# Patient Record
Sex: Female | Born: 1950
Health system: Southern US, Community
[De-identification: ages and names within clinical notes are randomized; demographics above are authoritative.]

## PROBLEM LIST (undated history)

## (undated) DIAGNOSIS — C50919 Malignant neoplasm of unspecified site of unspecified female breast: Secondary | ICD-10-CM

## (undated) DIAGNOSIS — E559 Vitamin D deficiency, unspecified: Secondary | ICD-10-CM

## (undated) DIAGNOSIS — N393 Stress incontinence (female) (male): Secondary | ICD-10-CM

## (undated) DIAGNOSIS — T7840XA Allergy, unspecified, initial encounter: Secondary | ICD-10-CM

## (undated) DIAGNOSIS — Z923 Personal history of irradiation: Secondary | ICD-10-CM

## (undated) DIAGNOSIS — K219 Gastro-esophageal reflux disease without esophagitis: Secondary | ICD-10-CM

## (undated) HISTORY — DX: Vitamin D deficiency, unspecified: E55.9

## (undated) HISTORY — DX: Malignant neoplasm of unspecified site of unspecified female breast: C50.919

## (undated) HISTORY — DX: Stress incontinence (female) (male): N39.3

---

## 2002-09-19 ENCOUNTER — Emergency Department (HOSPITAL_COMMUNITY): Admission: EM | Admit: 2002-09-19 | Discharge: 2002-09-19 | Payer: Self-pay | Admitting: Emergency Medicine

## 2002-09-19 ENCOUNTER — Encounter: Payer: Self-pay | Admitting: Emergency Medicine

## 2002-10-09 ENCOUNTER — Encounter: Payer: Self-pay | Admitting: Internal Medicine

## 2002-10-09 ENCOUNTER — Ambulatory Visit (HOSPITAL_COMMUNITY): Admission: RE | Admit: 2002-10-09 | Discharge: 2002-10-09 | Payer: Self-pay | Admitting: Internal Medicine

## 2002-10-09 ENCOUNTER — Encounter: Admission: RE | Admit: 2002-10-09 | Discharge: 2002-10-09 | Payer: Self-pay | Admitting: Internal Medicine

## 2002-10-16 ENCOUNTER — Encounter: Payer: Self-pay | Admitting: Internal Medicine

## 2002-10-16 ENCOUNTER — Encounter: Admission: RE | Admit: 2002-10-16 | Discharge: 2002-10-16 | Payer: Self-pay | Admitting: Internal Medicine

## 2003-02-08 ENCOUNTER — Encounter: Admission: RE | Admit: 2003-02-08 | Discharge: 2003-02-08 | Payer: Self-pay | Admitting: Obstetrics and Gynecology

## 2003-02-08 ENCOUNTER — Other Ambulatory Visit: Admission: RE | Admit: 2003-02-08 | Discharge: 2003-02-08 | Payer: Self-pay | Admitting: Obstetrics and Gynecology

## 2003-08-08 ENCOUNTER — Encounter: Admission: RE | Admit: 2003-08-08 | Discharge: 2003-08-08 | Payer: Self-pay | Admitting: Obstetrics and Gynecology

## 2003-08-08 ENCOUNTER — Encounter: Payer: Self-pay | Admitting: Obstetrics and Gynecology

## 2003-08-23 ENCOUNTER — Encounter: Payer: Self-pay | Admitting: Family Medicine

## 2003-08-23 ENCOUNTER — Encounter: Admission: RE | Admit: 2003-08-23 | Discharge: 2003-08-23 | Payer: Self-pay | Admitting: Family Medicine

## 2004-02-05 ENCOUNTER — Encounter: Admission: RE | Admit: 2004-02-05 | Discharge: 2004-02-05 | Payer: Self-pay | Admitting: Obstetrics and Gynecology

## 2004-02-14 ENCOUNTER — Other Ambulatory Visit: Admission: RE | Admit: 2004-02-14 | Discharge: 2004-02-14 | Payer: Self-pay | Admitting: Obstetrics and Gynecology

## 2005-02-16 ENCOUNTER — Other Ambulatory Visit: Admission: RE | Admit: 2005-02-16 | Discharge: 2005-02-16 | Payer: Self-pay | Admitting: Obstetrics and Gynecology

## 2005-12-18 ENCOUNTER — Encounter: Admission: RE | Admit: 2005-12-18 | Discharge: 2005-12-18 | Payer: Self-pay | Admitting: Obstetrics and Gynecology

## 2006-02-17 ENCOUNTER — Other Ambulatory Visit: Admission: RE | Admit: 2006-02-17 | Discharge: 2006-02-17 | Payer: Self-pay | Admitting: Obstetrics & Gynecology

## 2006-12-21 ENCOUNTER — Encounter: Admission: RE | Admit: 2006-12-21 | Discharge: 2006-12-21 | Payer: Self-pay | Admitting: Obstetrics and Gynecology

## 2007-01-23 ENCOUNTER — Emergency Department (HOSPITAL_COMMUNITY): Admission: EM | Admit: 2007-01-23 | Discharge: 2007-01-23 | Payer: Self-pay | Admitting: Family Medicine

## 2007-03-24 ENCOUNTER — Other Ambulatory Visit: Admission: RE | Admit: 2007-03-24 | Discharge: 2007-03-24 | Payer: Self-pay | Admitting: Obstetrics & Gynecology

## 2008-05-09 ENCOUNTER — Encounter: Admission: RE | Admit: 2008-05-09 | Discharge: 2008-05-09 | Payer: Self-pay | Admitting: Obstetrics and Gynecology

## 2008-06-28 ENCOUNTER — Other Ambulatory Visit: Admission: RE | Admit: 2008-06-28 | Discharge: 2008-06-28 | Payer: Self-pay | Admitting: Obstetrics and Gynecology

## 2010-01-23 ENCOUNTER — Encounter: Admission: RE | Admit: 2010-01-23 | Discharge: 2010-01-23 | Payer: Self-pay | Admitting: Obstetrics and Gynecology

## 2010-12-20 ENCOUNTER — Encounter: Payer: Self-pay | Admitting: Obstetrics and Gynecology

## 2010-12-21 ENCOUNTER — Encounter: Payer: Self-pay | Admitting: Family Medicine

## 2011-04-30 ENCOUNTER — Telehealth: Payer: Self-pay

## 2011-04-30 NOTE — Telephone Encounter (Signed)
Wrong pt

## 2011-05-27 ENCOUNTER — Other Ambulatory Visit: Payer: Self-pay | Admitting: Obstetrics and Gynecology

## 2011-05-27 DIAGNOSIS — Z1231 Encounter for screening mammogram for malignant neoplasm of breast: Secondary | ICD-10-CM

## 2011-06-01 ENCOUNTER — Ambulatory Visit
Admission: RE | Admit: 2011-06-01 | Discharge: 2011-06-01 | Disposition: A | Discharge status: Home / Self Care | Payer: Commercial Managed Care - PPO | Source: Ambulatory Visit | Attending: Obstetrics and Gynecology | Admitting: Obstetrics and Gynecology

## 2011-06-01 DIAGNOSIS — Z1231 Encounter for screening mammogram for malignant neoplasm of breast: Secondary | ICD-10-CM

## 2012-01-01 ENCOUNTER — Other Ambulatory Visit: Payer: Self-pay | Admitting: Obstetrics and Gynecology

## 2012-01-01 DIAGNOSIS — N644 Mastodynia: Secondary | ICD-10-CM

## 2012-01-07 ENCOUNTER — Ambulatory Visit
Admission: RE | Admit: 2012-01-07 | Discharge: 2012-01-07 | Disposition: A | Discharge status: Home / Self Care | Payer: Commercial Managed Care - PPO | Source: Ambulatory Visit | Attending: Obstetrics and Gynecology | Admitting: Obstetrics and Gynecology

## 2012-01-07 DIAGNOSIS — N644 Mastodynia: Secondary | ICD-10-CM

## 2012-12-02 LAB — HM PAP SMEAR: HM Pap smear: NEGATIVE

## 2012-12-06 ENCOUNTER — Other Ambulatory Visit: Payer: Self-pay | Admitting: Nurse Practitioner

## 2012-12-06 DIAGNOSIS — Z780 Asymptomatic menopausal state: Secondary | ICD-10-CM

## 2013-02-24 ENCOUNTER — Encounter: Payer: Self-pay | Admitting: Physician Assistant

## 2013-02-24 ENCOUNTER — Ambulatory Visit (INDEPENDENT_AMBULATORY_CARE_PROVIDER_SITE_OTHER): Payer: 59 | Admitting: Physician Assistant

## 2013-02-24 VITALS — BP 142/81 | HR 67 | Temp 98.1°F | Ht 64.5 in | Wt 184.6 lb

## 2013-02-24 DIAGNOSIS — R42 Dizziness and giddiness: Secondary | ICD-10-CM

## 2013-02-24 DIAGNOSIS — J322 Chronic ethmoidal sinusitis: Secondary | ICD-10-CM

## 2013-02-24 MED ORDER — MECLIZINE HCL 25 MG PO TABS: 25.0000 mg | ORAL_TABLET | Freq: Every evening | ORAL | Status: DC | PRN

## 2013-02-24 MED ORDER — AMOXICILLIN 875 MG PO TABS: 875.0000 mg | ORAL_TABLET | Freq: Two times a day (BID) | ORAL | Status: DC

## 2013-02-24 NOTE — Progress Notes (Signed)
  Subjective:    Patient ID: Deborah Mercado, female    DOB: 06-01-1951, 62 y.o.   MRN: [id number]  HPI Woke this am with dizziness and a swelling of scalp behind right ear; hx of cellulitis which felt similar to this    Review of Systems  HENT: Positive for tinnitus.   Neurological: Positive for dizziness and light-headedness.  All other systems reviewed and are negative.       Objective:   Physical Exam  Vitals reviewed. Constitutional: She is oriented to person, place, and time. She appears well-developed and well-nourished.  HENT:  Head: Atraumatic.  Right Ear: External ear normal.  Left Ear: External ear normal.  Right post auricular node swollen and tender, no erythema to the scalp in that region Right TM retraction  Eyes: Conjunctivae and EOM are normal. Pupils are equal, round, and reactive to light.  Neck: Normal range of motion. Neck supple.  Cardiovascular: Normal rate, regular rhythm and normal heart sounds.   Pulmonary/Chest: Effort normal and breath sounds normal.  Neurological: She is alert and oriented to person, place, and time.  Skin: Skin is warm and dry.  Psychiatric: She has a normal mood and affect. Her behavior is normal. Judgment and thought content normal.          Assessment & Plan:  Ethmoid sinusitis - Plan: amoxicillin (AMOXIL) 875 MG tablet  Vertigo - Plan: meclizine (ANTIVERT) 25 MG tablet

## 2013-03-02 ENCOUNTER — Other Ambulatory Visit: Payer: Self-pay | Admitting: Physician Assistant

## 2013-03-02 ENCOUNTER — Telehealth: Payer: Self-pay | Admitting: Physician Assistant

## 2013-03-02 DIAGNOSIS — J322 Chronic ethmoidal sinusitis: Secondary | ICD-10-CM

## 2013-03-02 NOTE — Telephone Encounter (Signed)
Dont usually do refills on Amoxicillin. Will agree to refill meclizine

## 2013-03-02 NOTE — Telephone Encounter (Signed)
Pt said that andrew told her that she would need two doses of the antibiotic can we please refill

## 2013-03-02 NOTE — Telephone Encounter (Signed)
Would like to have the Amox. Med called to [facility].  Feeling better but wants to be sure it goes completely away. Can't come back in for office visit.

## 2013-03-03 ENCOUNTER — Other Ambulatory Visit: Payer: Self-pay | Admitting: Physician Assistant

## 2013-03-03 DIAGNOSIS — J322 Chronic ethmoidal sinusitis: Secondary | ICD-10-CM

## 2013-03-03 MED ORDER — AMOXICILLIN 875 MG PO TABS: 875.0000 mg | ORAL_TABLET | Freq: Two times a day (BID) | ORAL | Status: DC

## 2013-03-03 NOTE — Telephone Encounter (Signed)
Pt upset that she could not have a second prescription of antibiotics.  She felt ACM should not have suggested he may need to give her one and then not follow through.  She says she will not come for her follow up visit and may not return at all.   She had verbalized feeling much better on our conversation a day earlier.

## 2013-03-03 NOTE — Telephone Encounter (Signed)
Wille Celeste it looks like you missed my message from earlier today - you were to call and tell her the antibiotic was authorized to the pharmacy

## 2013-03-03 NOTE — Telephone Encounter (Signed)
Med authorized; please notify pt

## 2013-03-03 NOTE — Telephone Encounter (Signed)
Refill of Amoxicillin sent to [facility].

## 2013-03-14 ENCOUNTER — Telehealth: Payer: Self-pay | Admitting: Nurse Practitioner

## 2013-03-16 NOTE — Telephone Encounter (Signed)
LEFT A DETAILED MESSAGE THAT IF SHE STILL NEEDED [place] TO PLEASE CALL AND ASK FOR THE TRIAGE NURSE

## 2013-04-04 ENCOUNTER — Telehealth: Payer: Self-pay | Admitting: Nurse Practitioner

## 2013-04-04 ENCOUNTER — Other Ambulatory Visit: Payer: Self-pay | Admitting: Orthopedic Surgery

## 2013-04-04 NOTE — Telephone Encounter (Signed)
LM for pt that samples will be in front office to be picked up during business hours. Savings card also in bag.  aa

## 2013-04-04 NOTE — Telephone Encounter (Signed)
Spoke with pt who has been on Vesicare for a while. Pt reports her copay has gone up to $20 and she is wondering if she could get some samples. Please advise.

## 2013-04-04 NOTE — Telephone Encounter (Signed)
Patient is asking for Vesicare samples

## 2013-04-04 NOTE — Telephone Encounter (Signed)
By all means if we have samples lets help her out if we have the dose she is on.

## 2013-04-11 ENCOUNTER — Ambulatory Visit (HOSPITAL_BASED_OUTPATIENT_CLINIC_OR_DEPARTMENT_OTHER)
Admission: RE | Admit: 2013-04-11 | Discharge: 2013-04-11 | Disposition: A | Discharge status: Home / Self Care | Payer: 59 | Source: Ambulatory Visit | Attending: Family Medicine | Admitting: Family Medicine

## 2013-04-11 ENCOUNTER — Other Ambulatory Visit (HOSPITAL_BASED_OUTPATIENT_CLINIC_OR_DEPARTMENT_OTHER): Payer: Self-pay | Admitting: Family Medicine

## 2013-04-11 DIAGNOSIS — S82009A Unspecified fracture of unspecified patella, initial encounter for closed fracture: Secondary | ICD-10-CM | POA: Insufficient documentation

## 2013-04-11 DIAGNOSIS — S99929A Unspecified injury of unspecified foot, initial encounter: Secondary | ICD-10-CM

## 2013-04-11 DIAGNOSIS — S8990XA Unspecified injury of unspecified lower leg, initial encounter: Secondary | ICD-10-CM

## 2013-04-11 DIAGNOSIS — W19XXXA Unspecified fall, initial encounter: Secondary | ICD-10-CM | POA: Insufficient documentation

## 2013-04-11 DIAGNOSIS — S99919A Unspecified injury of unspecified ankle, initial encounter: Secondary | ICD-10-CM

## 2013-08-01 ENCOUNTER — Other Ambulatory Visit: Payer: Self-pay

## 2013-08-01 DIAGNOSIS — Z1231 Encounter for screening mammogram for malignant neoplasm of breast: Secondary | ICD-10-CM

## 2013-08-07 ENCOUNTER — Ambulatory Visit
Admission: RE | Admit: 2013-08-07 | Discharge: 2013-08-07 | Disposition: A | Discharge status: Home / Self Care | Payer: 59 | Source: Ambulatory Visit

## 2013-08-07 DIAGNOSIS — Z1231 Encounter for screening mammogram for malignant neoplasm of breast: Secondary | ICD-10-CM

## 2013-08-14 ENCOUNTER — Telehealth: Payer: Self-pay | Admitting: Nurse Practitioner

## 2013-08-14 NOTE — Telephone Encounter (Signed)
LMTCB

## 2013-08-14 NOTE — Telephone Encounter (Signed)
Pt would like to have to PG fill out and sign a form for her to be exempt from taking the flu shot. If this is something she can do she will bring it by tomorrow.

## 2013-08-14 NOTE — Telephone Encounter (Signed)
Patient is coming by tomorrow to bring a form in to be filled out. Wants to know if we have Visicare samples? If so please leave some up front. She will be here after lunch.

## 2013-08-15 NOTE — Telephone Encounter (Signed)
Samples to front desk  Lot numbers:  [id number] exp 10/2015 [id number] exp 09/2014 [id number] ep 11/2013

## 2013-08-15 NOTE — Telephone Encounter (Signed)
Patient came in and dropped off forms for Deborah Mercado. Also, patient still wants to know about samples for Vesicare?

## 2013-08-15 NOTE — Telephone Encounter (Signed)
I have attempted to contact this patient by phone with the following results: left message to return my call on answering machine (home).  

## 2013-08-15 NOTE — Telephone Encounter (Signed)
Patient with hx of Localized reaction to influenza vaccine in 2008 with swelling to arm/bicep. Reported to [employer] per paper chart. Paper chart and form to your office.  Samples of Vesicare 5 mg available, okay to give patient when she picks up forms?

## 2013-08-15 NOTE — Telephone Encounter (Signed)
Yes will complete form for her - she is allergic to the influenza vaccine.

## 2013-08-15 NOTE — Telephone Encounter (Signed)
Yes form is completed and signed.  If we have samples OK to give patient but I believe that Amy has looked for this already.

## 2013-08-15 NOTE — Telephone Encounter (Signed)
Call to pt's VM and LM that we do not have any vesicare 5 mg samples, and likely will not be getting any more. Advised that PG would be glad to refill if needed. Pt to call back.

## 2013-08-16 NOTE — Telephone Encounter (Signed)
Pt returned call and form was faxed.

## 2013-08-18 NOTE — Telephone Encounter (Signed)
Spoke with patient. She states form was to be faxed.  Advised Vesicare Samples at front desk, she will pick up.

## 2013-09-19 ENCOUNTER — Telehealth: Payer: Self-pay | Admitting: Emergency Medicine

## 2013-09-19 NOTE — Telephone Encounter (Signed)
Patient has one bag of samples at front desk. Called to see if she wants to pick up.  There were two bags and she only picked up one.  Message left to return call to [place] at [phone].

## 2013-09-20 NOTE — Telephone Encounter (Signed)
Spoke with patient,. She will come pick up remainder of samples.

## 2013-12-13 ENCOUNTER — Ambulatory Visit: Payer: Self-pay | Admitting: Obstetrics and Gynecology

## 2013-12-14 ENCOUNTER — Ambulatory Visit: Payer: Self-pay | Admitting: Obstetrics and Gynecology

## 2014-02-19 ENCOUNTER — Encounter: Payer: Self-pay | Admitting: Nurse Practitioner

## 2014-02-19 ENCOUNTER — Ambulatory Visit (INDEPENDENT_AMBULATORY_CARE_PROVIDER_SITE_OTHER): Payer: 59 | Admitting: Nurse Practitioner

## 2014-02-19 ENCOUNTER — Telehealth: Payer: Self-pay | Admitting: Nurse Practitioner

## 2014-02-19 VITALS — BP 114/66 | HR 72 | Ht 64.5 in | Wt 186.0 lb

## 2014-02-19 DIAGNOSIS — Z01419 Encounter for gynecological examination (general) (routine) without abnormal findings: Secondary | ICD-10-CM

## 2014-02-19 DIAGNOSIS — K219 Gastro-esophageal reflux disease without esophagitis: Secondary | ICD-10-CM | POA: Insufficient documentation

## 2014-02-19 DIAGNOSIS — E559 Vitamin D deficiency, unspecified: Secondary | ICD-10-CM | POA: Insufficient documentation

## 2014-02-19 DIAGNOSIS — N3941 Urge incontinence: Secondary | ICD-10-CM

## 2014-02-19 DIAGNOSIS — Z0000 Encounter for general adult medical examination without abnormal findings: Secondary | ICD-10-CM | POA: Insufficient documentation

## 2014-02-19 LAB — POCT URINALYSIS DIPSTICK
Bilirubin, UA: NEGATIVE
Glucose, UA: NEGATIVE
Ketones, UA: NEGATIVE
LEUKOCYTES UA: NEGATIVE
NITRITE UA: NEGATIVE
PH UA: 5
PROTEIN UA: NEGATIVE
RBC UA: NEGATIVE
Urobilinogen, UA: NEGATIVE

## 2014-02-19 MED ORDER — PANTOPRAZOLE SODIUM 20 MG PO TBEC: 20.0000 mg | DELAYED_RELEASE_TABLET | Freq: Every day | ORAL | Status: DC

## 2014-02-19 MED ORDER — ESTRADIOL 1 MG PO TABS
1.0000 mg | ORAL_TABLET | Freq: Every day | ORAL | Status: DC
Start: 2014-02-19 — End: 2015-03-18

## 2014-02-19 MED ORDER — MEDROXYPROGESTERONE ACETATE 5 MG PO TABS: 5.0000 mg | ORAL_TABLET | Freq: Every day | ORAL | Status: DC

## 2014-02-19 MED ORDER — SOLIFENACIN SUCCINATE 5 MG PO TABS: 5.0000 mg | ORAL_TABLET | Freq: Every day | ORAL | Status: DC

## 2014-02-19 NOTE — Patient Instructions (Signed)

## 2014-02-19 NOTE — Progress Notes (Signed)
Patient ID: Deborah Mercado, female   DOB: 07-30-1951, 63 y.o.   MRN: [id number] 63 y.o. G1P1001 Legally Separated Caucasian Fe here for annual exam.  [place] on mothers day of last year and fractured left patella. Had knee brace for 8 weeks.  Same partner but not SA in years. Recent flare of GERD - needs a refill on Protonix.  Patient's last menstrual period was 11/30/2004.          Sexually active: no  The current method of family planning is abstinence.    Exercising: no  The patient does not participate in regular exercise at present. Smoker:  no  Health Maintenance: Pap:  12/02/12, WNL, neg HR HPV MMG:  08/07/13, Bi-Rads 1: negative Colonoscopy:  none BMD:  none TDaP:  2014  Labs:  HB:  13.7 Urine:  Negative    reports that she has never smoked. She has never used smokeless tobacco. She reports that she does not drink alcohol or use illicit drugs.  Past Medical History  Diagnosis Date  . Vitamin D deficiency   . SUI (stress urinary incontinence, female)     History reviewed. No pertinent past surgical history.  Current Outpatient Prescriptions  Medication Sig Dispense Refill  . estradiol (ESTRACE) 1 MG tablet Take 1 tablet (1 mg total) by mouth daily.  90 tablet  3  . medroxyPROGESTERone (PROVERA) 5 MG tablet Take 1 tablet (5 mg total) by mouth daily.  90 tablet  3  . solifenacin (VESICARE) 5 MG tablet Take 1 tablet (5 mg total) by mouth daily.  90 tablet  3  . pantoprazole (PROTONIX) 20 MG tablet Take 1 tablet (20 mg total) by mouth daily.  90 tablet  1  . Vitamin D, Ergocalciferol, (DRISDOL) 50000 UNITS CAPS capsule Take 50,000 Units by mouth every 7 (seven) days.       No current facility-administered medications for this visit.    Family History  Problem Relation Age of Onset  . Heart disease Mother   . Rheum arthritis Mother   . Hypertension Mother   . Rheum arthritis Maternal Grandmother   . Heart attack Maternal Grandmother   . Heart disease Maternal Grandmother      ROS:  Pertinent items are noted in HPI.  Otherwise, a comprehensive ROS was negative.  Exam:   BP 114/66  Pulse 72  Ht 5' 4.5" (1.638 m)  Wt 186 lb (84.369 kg)  BMI 31.45 kg/m2  LMP 11/30/2004 Height: 5' 4.5" (163.8 cm)  Ht Readings from Last 3 Encounters:  02/19/14 5' 4.5" (1.638 m)  02/24/13 5' 4.5" (1.638 m)    General appearance: alert, cooperative and appears stated age Head: Normocephalic, without obvious abnormality, atraumatic Neck: no adenopathy, supple, symmetrical, trachea midline and thyroid normal to inspection and palpation Lungs: clear to auscultation bilaterally Breasts: normal appearance, no masses or tenderness Heart: regular rate and rhythm Abdomen: soft, non-tender; no masses,  no organomegaly Extremities: extremities normal, atraumatic, no cyanosis or edema Skin: Skin color, texture, turgor normal. No rashes or lesions Lymph nodes: Cervical, supraclavicular, and axillary nodes normal. No abnormal inguinal nodes palpated Neurologic: Grossly normal   Pelvic: External genitalia:  no lesions              Urethra:  normal appearing urethra with no masses, tenderness or lesions              Bartholin's and Skene's: normal  Vagina: normal appearing vagina with normal color and discharge, no lesions              Cervix: anteverted              Pap taken: no Bimanual Exam:  Uterus:  normal size, contour, position, consistency, mobility, non-tender              Adnexa: no mass, fullness, tenderness               Rectovaginal: Confirms               Anus:  normal sphincter tone, no lesions  A:  Well Woman with normal exam  Postmenopausal on HRT  SUI doing well on Vesicare  History of Vit D deficiency  History of GERD - with recent flare  P:   Pap smear as per guidelines   Mammogram due 9/15  Refill on HRT for a year - she is now taking 1/2 tablet of Estradiol and Provera - maybe willing to come off by next year.  Counseled with risk of  DVT, CVA, cancer, etc.  Refill on Vesicare and Protonix - also refill Vit D and await lab return.  Follow with labs  Counseled on breast self exam, mammography screening, use and side effects of HRT, adequate intake of calcium and vitamin D, diet and exercise, Kegel's exercises return annually or prn  An After Visit Summary was printed and given to the patient.

## 2014-02-19 NOTE — Telephone Encounter (Signed)
Patient requesting a note for work stating she has bladder urgency. She states Deborah Mercado has done this before and it is so she can use the bathroom when she needs to. Please call patient when the note is ready and she will come and pick it up.

## 2014-02-20 LAB — COMPREHENSIVE METABOLIC PANEL
ALK PHOS: 64 U/L (ref 39–117)
ALT: 9 U/L (ref 0–35)
AST: 16 U/L (ref 0–37)
Albumin: 4.2 g/dL (ref 3.5–5.2)
BUN: 9 mg/dL (ref 6–23)
CALCIUM: 9.2 mg/dL (ref 8.4–10.5)
CHLORIDE: 107 meq/L (ref 96–112)
CO2: 27 mEq/L (ref 19–32)
CREATININE: 0.8 mg/dL (ref 0.50–1.10)
Glucose, Bld: 88 mg/dL (ref 70–99)
POTASSIUM: 4 meq/L (ref 3.5–5.3)
Sodium: 142 mEq/L (ref 135–145)
Total Bilirubin: 0.7 mg/dL (ref 0.2–1.2)
Total Protein: 7 g/dL (ref 6.0–8.3)

## 2014-02-20 LAB — LIPID PANEL
Cholesterol: 187 mg/dL (ref 0–200)
HDL: 54 mg/dL (ref >39)
LDL CALC: 120 mg/dL — AB (ref 0–99)
TRIGLYCERIDES: 67 mg/dL (ref <150)
Total CHOL/HDL Ratio: 3.5 Ratio
VLDL: 13 mg/dL (ref 0–40)

## 2014-02-20 LAB — VITAMIN D 25 HYDROXY (VIT D DEFICIENCY, FRACTURES): Vit D, 25-Hydroxy: 27 ng/mL — ABNORMAL LOW (ref 30–89)

## 2014-02-20 LAB — TSH: TSH: 1.429 u[IU]/mL (ref 0.350–4.500)

## 2014-02-20 LAB — HEMOGLOBIN, FINGERSTICK: HEMOGLOBIN, FINGERSTICK: 13.7 g/dL (ref 12.0–16.0)

## 2014-02-20 MED ORDER — VITAMIN D (ERGOCALCIFEROL) 1.25 MG (50000 UNIT) PO CAPS: 50000.0000 [IU] | ORAL_CAPSULE | ORAL | Status: DC

## 2014-02-20 NOTE — Progress Notes (Signed)
Encounter reviewed by Dr. Josefa Half. Colonoscopy recommended if not already scheduled.

## 2014-02-21 ENCOUNTER — Telehealth: Payer: Self-pay | Admitting: *Deleted

## 2014-02-21 NOTE — Telephone Encounter (Signed)
I have attempted to contact this patient by phone with the following results: left message to return my call on answering machine (home per DPR).  

## 2014-02-21 NOTE — Telephone Encounter (Signed)
Message copied by Graylon Good on Wed Feb 21, 2014  1:36 PM ------      Message from: Kem Boroughs R      Created: Tue Feb 20, 2014  7:24 AM       Let patient know that Vit D is low and to continue on Vit D per protocol. Refill has been sent to [facility]. She had been off Vit D for several weeks before this lab.  Also lipid panel is normal other than LDL slight up at 120.  TSH and CMP is normal. ------

## 2014-02-21 NOTE — Telephone Encounter (Signed)
Pt presented to office to pick up note.  Note written and given to patient.  Hardcopy of note sent to scan.

## 2014-02-22 NOTE — Telephone Encounter (Signed)
Pt notified in result note.  

## 2014-08-28 ENCOUNTER — Encounter: Payer: Self-pay | Admitting: Certified Nurse Midwife

## 2014-08-28 ENCOUNTER — Ambulatory Visit (INDEPENDENT_AMBULATORY_CARE_PROVIDER_SITE_OTHER): Payer: 59 | Admitting: Certified Nurse Midwife

## 2014-08-28 VITALS — BP 120/80 | HR 76 | Temp 98.9°F | Resp 18 | Ht 64.5 in | Wt 191.0 lb

## 2014-08-28 DIAGNOSIS — B3731 Acute candidiasis of vulva and vagina: Secondary | ICD-10-CM

## 2014-08-28 DIAGNOSIS — B373 Candidiasis of vulva and vagina: Secondary | ICD-10-CM

## 2014-08-28 DIAGNOSIS — R300 Dysuria: Secondary | ICD-10-CM

## 2014-08-28 LAB — POCT URINALYSIS DIPSTICK
BILIRUBIN UA: NEGATIVE
GLUCOSE UA: NEGATIVE
Ketones, UA: NEGATIVE
Leukocytes, UA: NEGATIVE
NITRITE UA: NEGATIVE
PH UA: 6
Protein, UA: NEGATIVE
RBC UA: NEGATIVE
Urobilinogen, UA: NEGATIVE

## 2014-08-28 MED ORDER — FLUCONAZOLE 100 MG PO TABS: ORAL_TABLET | ORAL | Status: DC

## 2014-08-28 MED ORDER — PHENAZOPYRIDINE HCL 100 MG PO TABS: 100.0000 mg | ORAL_TABLET | Freq: Three times a day (TID) | ORAL | Status: DC | PRN

## 2014-08-28 NOTE — Patient Instructions (Signed)

## 2014-08-28 NOTE — Progress Notes (Signed)
63 yo white female legally separated  g1p1001 here with complaint of UTI, with onset  on 5 days ago. Was seen at urgent care and given Macrobid and told to use Monistat OTC. Patient feels she is somewhat better, but still has burning with urination. Denies urgency, has history of frequency uses Vesicare with good response. . Patient denies pain with urination. Patient denies fever, chills, nausea or back pain. No new personal products. Patient not sexually active. Denies any vaginal symptoms, except burning and no new personal products.. Menopausal ? vaginal dryness. Patient asking for note for work to empty bladder more frequently, works as Engineering geologist at [employer]. Patient also needs note to decline Flu vaccine due to mercury content. Patient did not bring form. Patient requesting Valium for sleep. No other health issues today.   O: Healthy female WDWN Affect: Normal, orientation x 3 Skin : warm and dry CVAT: negative bilateral Abdomen: positive slightly for suprapubic tenderness  Pelvic exam: External genital area: slight redness noted with slight exudate, no scaling, no lesions wet prep taken Bladder only very slight tenderness,Urethra, Urethral meatus: not tender Vagina: white thick vaginal discharge, normal appearance  Wet prep  Taken, ph 4.0 Cervix: normal, non tender Uterus:normal,non tender Adnexa: normal non tender, no fullness or masses  Wet prep; positive for yeast vagina and vulva   A: UTI under treatment with Macrobid, appears to appropriate choice from previous provider Yeast vaginitis/vulvitis Vaginal dryness Mercury allergy needs flu vaccine refusal, patient to bring form by to be filled out. Requesting valium  rx.  P: Reviewed findings of UTI, with neg. POCT urine, and feel UTI responding to [place], continue as directed NO:[id number] see order, to decrease occasional burning BVQ:XIHWT  culture Reviewed warning signs and symptoms of UTI Encouraged to limit soda,  tea, and coffee Discussed findings of yeast and probably issue with burning Rx Diflucan see order.  OTC Aveeno anti itch cream with moisture for external tissue. Patient to call if symptoms increase, instead of subside. Discussed Coconut oil for vaginal dryness after this has all resolved. Questions addressed. Patient given note for work. Discussed with patient that we do not Rx Valium for sleep. Discussed OTC measures and decrease in caffeine use.  RV prn

## 2014-08-29 LAB — URINE CULTURE
Colony Count: NO GROWTH
ORGANISM ID, BACTERIA: NO GROWTH

## 2014-08-29 NOTE — Progress Notes (Signed)
Encounter reviewed by Dr. Brook Silva.  

## 2014-08-30 ENCOUNTER — Telehealth: Payer: Self-pay

## 2014-08-30 NOTE — Telephone Encounter (Signed)
lmtcb

## 2014-08-30 NOTE — Telephone Encounter (Signed)
Spoke with patient. Advised of results as seen below. Patient is agreeable and verbalizes understanding. Patient states that she still does not feel well and does not know what to do. "Sometimes it takes me two rounds of antibiotics to feel better." States that she is still having burning with urination. Patient has used aveeno cream and states it provides some relief from vaginal irritation. Advised patient would send a message to Regina Eck CNM and return call with further recommendations and instructions.

## 2014-08-30 NOTE — Telephone Encounter (Signed)
Has patient used the Pyridium which should stop the burning. She can also do Aveeno bath for comfort.

## 2014-08-30 NOTE — Telephone Encounter (Signed)
Message copied by Susy Manor on Thu Aug 30, 2014  9:57 AM ------      Message from: Regina Eck      Created: Wed Aug 29, 2014  6:12 PM       Notify patient that urine culture is negative. No further treatment needed. ------

## 2014-08-31 NOTE — Telephone Encounter (Signed)
Spoke with patient. Advised of message as seen below from [place]. Patient states that she has been taking the pyridium and using aveeno baths. Advised to continue with this. Patient states that she feels "a little better today." Advised if anything changes over the weekend to see urgent care. Advised if not feeling better by next Monday will need follow up in office with Deborah Mercado CNM. Patient is agreeable.  Routing to provider for final review. Patient agreeable to disposition. Will close encounter

## 2014-09-03 ENCOUNTER — Telehealth: Payer: Self-pay | Admitting: Certified Nurse Midwife

## 2014-09-03 ENCOUNTER — Other Ambulatory Visit: Payer: Self-pay | Admitting: Nurse Practitioner

## 2014-09-03 NOTE — Telephone Encounter (Signed)
Pt was seen on 08/28/14 and she states she is still having symptoms. She took the past pyridium yesterday and is requesting another rx. Pt feels like she just needs a long treatment with the pyridiuim due to her sitting for over 8 hrs on a pad. She also took the last [place] today as well. Pt would like another dose of pyridium.

## 2014-09-03 NOTE — Telephone Encounter (Signed)
Patient calling requesting a refill on: phenazopyridine (PYRIDIUM) 100 MG tablet  Take 1 tablet (100 mg total) by mouth 3 (three) times daily as needed for pain., Starting 08/28/2014, Until Discontinued, Normal, Last Dose: Not Recorded  Refills: 0 ordered Pharmacy: [place], [place].

## 2014-09-03 NOTE — Telephone Encounter (Signed)
LMTCB

## 2014-09-03 NOTE — Telephone Encounter (Signed)
The second Diflucan should help with her symptoms. Long term Pyridium is not recommended. She can start on Azo cranberry which should help. If this is not resolving she needs follow up visit.

## 2014-09-03 NOTE — Telephone Encounter (Signed)
Last refilled/AEX: 02/19/14 #90/1 refill was sent by Ms. Patty AEX Scheduled: No AEX scheduled for next year  Please Advise.

## 2014-09-04 NOTE — Telephone Encounter (Signed)
Pt was informed of the message. Encounter closed

## 2014-09-05 ENCOUNTER — Telehealth: Payer: Self-pay | Admitting: Nurse Practitioner

## 2014-09-05 ENCOUNTER — Encounter: Payer: Self-pay | Admitting: Nurse Practitioner

## 2014-09-05 ENCOUNTER — Ambulatory Visit (INDEPENDENT_AMBULATORY_CARE_PROVIDER_SITE_OTHER): Payer: 59 | Admitting: Nurse Practitioner

## 2014-09-05 VITALS — BP 118/76 | HR 76 | Temp 98.3°F | Resp 18 | Wt 191.0 lb

## 2014-09-05 DIAGNOSIS — R300 Dysuria: Secondary | ICD-10-CM

## 2014-09-05 DIAGNOSIS — B373 Candidiasis of vulva and vagina: Secondary | ICD-10-CM

## 2014-09-05 DIAGNOSIS — B3731 Acute candidiasis of vulva and vagina: Secondary | ICD-10-CM

## 2014-09-05 LAB — POCT URINALYSIS DIPSTICK
BILIRUBIN UA: NEGATIVE
GLUCOSE UA: NEGATIVE
KETONES UA: NEGATIVE
LEUKOCYTES UA: NEGATIVE
NITRITE UA: NEGATIVE
PH UA: 5
Protein, UA: NEGATIVE
Urobilinogen, UA: NEGATIVE

## 2014-09-05 MED ORDER — FLUCONAZOLE 150 MG PO TABS: ORAL_TABLET | ORAL | Status: DC

## 2014-09-05 MED ORDER — NYSTATIN-TRIAMCINOLONE 100000-0.1 UNIT/GM-% EX OINT: 1.0000 "application " | TOPICAL_OINTMENT | Freq: Two times a day (BID) | CUTANEOUS | Status: DC

## 2014-09-05 NOTE — Telephone Encounter (Signed)
Spoke with patient. Patient states that she has hardly slept due to vaginal burning. Patient states that her stomach feels like it is burning as well. Has been experiencing urinary frequency. Denies back pain, pressure, and fevers. Patient was seen on 9/29 with Deborah Mercado CNM. Was advised to complete Macrobid that she was given at urgent care. Patient states that she completed the macrobid and took her last diflucan on 10/5. Patient has been taking cranberry tablets, using sitz baths, and taking pyridium. Advised patient will need an appointment to be seen. Patient is agreeable. Appointment scheduled for today at 11:15am with Deborah Mercado, [place]. Agreeable to date and time.  Routing to provider for final review. Patient agreeable to disposition. Will close encounter

## 2014-09-05 NOTE — Telephone Encounter (Signed)
Pt is still having some irritation and abdominal pain.

## 2014-09-05 NOTE — Patient Instructions (Signed)

## 2014-09-05 NOTE — Progress Notes (Signed)
Subjective:     Patient ID: Deborah Mercado, female   DOB: Apr 17, 1951, 63 y.o.   MRN: [id number]  HPI   This 63 yo Fe had BV on about 2 weeks ago and took Metrogel.  Now no vaginal discharge, but feels irritated along the vulva and perianal area.  Used OTC Monistat with severe burning.  Also used Aveeno bath salts without help.  She did take Diflucan 150 mg initially and felt some better, then symptoms came back again.  No other changes in personal products.  Not SA. Still having to make frequent bathroom breaks and wearing a pad for long periods of time. She is still using the cranberry tablets but not having any urinary symptoms.  Her urine culture from 9/29 was negative.     Review of Systems  Constitutional: Negative for fever, chills and fatigue.  Gastrointestinal: Negative.   Genitourinary: Positive for vaginal pain. Negative for urgency, frequency, hematuria, flank pain, decreased urine volume, vaginal bleeding, pelvic pain and dyspareunia.  Musculoskeletal: Negative.   Skin: Negative.   Neurological: Negative.   Psychiatric/Behavioral: Negative.        Objective:   Physical Exam  Constitutional: She is oriented to person, place, and time. She appears well-developed and well-nourished. No distress.  Abdominal: Soft. She exhibits no distension. There is no tenderness. There is no rebound and no guarding.  Genitourinary:  Several areas external lalbia and perianal areas with linear cuts and erythema consistent with yeast.  No vaginal discharge.  No tenderness at the urethra.  Urine chemstrip trace of RBC most likely from irritation. No WBC or nitrates.  Did not send urine for culture  Neurological: She is alert and oriented to person, place, and time.  Psychiatric: She has a normal mood and affect. Her behavior is normal. Judgment and thought content normal.       Assessment:     Yeast vulvar vaginitis    Plan:     Diflucan 150 mg X 2 Triamcinolone and Nystatin cream  externally to areas BID until resolved Note for work stating she needs bathroom breaks often - this is to help her empty her bladder without wearing pads and getting infections.

## 2014-09-10 ENCOUNTER — Telehealth: Payer: Self-pay | Admitting: Nurse Practitioner

## 2014-09-10 MED ORDER — TERCONAZOLE 0.4 % VA CREA: 1.0000 | TOPICAL_CREAM | Freq: Every day | VAGINAL | Status: DC

## 2014-09-10 NOTE — Telephone Encounter (Signed)
Spoke with patient. Advised of message as seen below from Milford Cage, [place]. Patient is agreeable and verbalizes understanding. Patient would like rx sent to [facility]. rx sent. Patient agreeable.  Routing to provider for final review. Patient agreeable to disposition. Will close encounter

## 2014-09-10 NOTE — Telephone Encounter (Signed)
Spoke with patient. Patient states that she was seen last week and treated for yeast and is still having "irriatation." Patient has taken two diflucan. Last dose taken on Friday 10/9. Patient has been using nystatin cream BID with some relief. States that she is still having constant burning. "Patty told me it was yeast." Patient has tried oatmeal sitz baths with no relief. Was seen on 9/29 and treated for UTI and yeast. Patient denies any pain with urination, back pain or fevers. Denies vaginal discharge. Patient declines appointment stating "I have been seen three times for this same problem. I think I just need more medicine." Advised patient that I would send a message over to Milford Cage, [place] and return call with further recommendations and suggestions. Patient is agreeable.

## 2014-09-10 NOTE — Telephone Encounter (Signed)
Pt calling again about problem

## 2014-09-10 NOTE — Telephone Encounter (Signed)
Lets have her use Terazol 7 vaginal cream and after about 10 days call back with results. This will treat yeast not covered by Diflucan.

## 2014-09-10 NOTE — Telephone Encounter (Signed)
Patient was seen recently and is calling to speak with the nurse re: "irritation has not gotten better." Patient declined an appointment prior to speaking with the nurse.

## 2014-09-11 NOTE — Progress Notes (Signed)
Encounter reviewed by Dr. Janilah Hojnacki Silva.  

## 2014-09-18 ENCOUNTER — Telehealth: Payer: Self-pay | Admitting: Nurse Practitioner

## 2014-09-18 NOTE — Telephone Encounter (Signed)
Spoke with patient. Patient states that she is still having "vaginal irritation and burning." Patient was seen on 10/7 and treated with Diflucan 150 mg X 2 and Triamcinolone and Nystatin cream. Patient called in last week and was given terazol 7. Patient completed terazol on Sunday and states that she is still having vaginal burning. Advised patient will need to come in for evaluation. Patient is agreeable. Offered today at 10:15am and 11:15am but patient declines. Appointment scheduled for 10/23 at 12:45pm with Milford Cage, [place]. Patient is agreeable to date and time.  Routing to provider for final review. Patient agreeable to disposition. Will close encounter

## 2014-09-18 NOTE — Telephone Encounter (Signed)
Patient says "my problem has not cleared up from my last visit". No further details given.

## 2014-09-21 ENCOUNTER — Ambulatory Visit: Payer: 59 | Admitting: Nurse Practitioner

## 2014-09-24 ENCOUNTER — Telehealth: Payer: Self-pay | Admitting: Nurse Practitioner

## 2014-09-24 NOTE — Telephone Encounter (Signed)
Pt wants to come in today to see PG for vaginal irritation. No availble appointments and I offered an appointment with another provider but patient declined.

## 2014-09-24 NOTE — Telephone Encounter (Signed)
Spoke with patient. Patient states that she was using nystatin and mycolog for vaginal itching and had relief until yesterday. "Yesterday I started to have itching again. It is more external right now. I don't know why this keeps happening." Requesting an appointment today with Deborah Mercado, [place]. No available appointments but I offered appointment with another provider. Patient declines to see another provider. Offered appointment tomorrow afternoon with Deborah Cage, FNP but patient declines due to work schedule. Appointment scheduled for 10/29 at 10:15am with Deborah Cage, FNP as she is out of the office Wednesday. Advised patient to call and check for any cancellations for earlier with Deborah Cage, FNP. Patient is agreeable. Offered to schedule appointment on Wednesday morning with another provider but patient declines. Patient would like to know if she should start using the mycolog and nystatin cream again until appointment. Advised patient would send a message over to [facility], FNP and give her a call back with further recommendations and instructions. Patient is agreeable.

## 2014-09-25 ENCOUNTER — Ambulatory Visit (INDEPENDENT_AMBULATORY_CARE_PROVIDER_SITE_OTHER): Payer: 59 | Admitting: Nurse Practitioner

## 2014-09-25 ENCOUNTER — Encounter: Payer: Self-pay | Admitting: Nurse Practitioner

## 2014-09-25 VITALS — BP 120/82 | HR 76 | Ht 64.5 in | Wt 192.0 lb

## 2014-09-25 DIAGNOSIS — B3731 Acute candidiasis of vulva and vagina: Secondary | ICD-10-CM

## 2014-09-25 DIAGNOSIS — B373 Candidiasis of vulva and vagina: Secondary | ICD-10-CM

## 2014-09-25 DIAGNOSIS — N3941 Urge incontinence: Secondary | ICD-10-CM

## 2014-09-25 LAB — POCT URINALYSIS DIPSTICK
BILIRUBIN UA: NEGATIVE
GLUCOSE UA: NEGATIVE
Ketones, UA: NEGATIVE
LEUKOCYTES UA: NEGATIVE
NITRITE UA: NEGATIVE
Protein, UA: NEGATIVE
RBC UA: NEGATIVE
UROBILINOGEN UA: NEGATIVE
pH, UA: 5

## 2014-09-25 MED ORDER — FLUCONAZOLE 150 MG PO TABS: 150.0000 mg | ORAL_TABLET | Freq: Once | ORAL | Status: DC

## 2014-09-25 NOTE — Progress Notes (Deleted)
Subjective:     Patient ID: Deborah Mercado, female   DOB: 02-16-1951, 63 y.o.   MRN: [id number]  HPI   Review of Systems     Objective:   Physical Exam  Constitutional: She is oriented to person, place, and time. She appears well-developed and well-nourished. No distress.  Abdominal: Soft. She exhibits no distension. There is no tenderness. There is no rebound and no guarding.  Genitourinary:  Several areas external lalbia and perianal areas with linear cuts and erythema consistent with yeast.  No vaginal discharge.  No tenderness at the urethra.  Urine chemstrip trace of RBC most likely from irritation. No WBC or nitrates.  Did not send urine for culture  Neurological: She is alert and oriented to person, place, and time.  Psychiatric: She has a normal mood and affect. Her behavior is normal. Judgment and thought content normal.       Assessment:     ***    Plan:     ***

## 2014-09-25 NOTE — Progress Notes (Signed)
63 y.o.DW Fe G1P1 here with recurrent complaints of vaginal and vulvar itching, burning, but without discharge.  Onset of symptoms 3 days ago after completion of Terazol cream HS X 7.  When she first finished med's symptoms were OK then started with vulvar irritation.  Denies new personal products or vaginal dryness.  She does wear pads daily and usually uses  Tena or Always pads.  No STD concerns, not SA in 10 years. Urinary symptoms with urgency and frequency but not a lot of change from usual.  No fever or chills. Finished Terazol on the 18 /19 th.  last CMP shows a normal glucose 02/19/14.   O: Healthy female WDWN Affect: normal, orientation x 3  Exam: Abdomen: soft Lymph node: no enlargement or tenderness Pelvic exam:no vaginal discharge, no redness.  Wet prep is done  External genital: multiple areas of irritation right groin, labia both sides, and left groin- this area look consistent with use of pad. BUS: negative Vagina: no discharge noted. Wet prep taken Adnexa:normal, non tender, no masses or fullness  Urine is negative  Wet Prep results:  Ph: 3.0; NSS: negative; KOH: negative   A: External irritation looks like from pad wear  Yeast of the vulva and groin   P: Discussed findings of yeast and etiology. Discussed Aveeno or baking soda sitz bath for comfort. Avoid moist clothes or pads for extended period of time. If working out in gym clothes or swim suits for long periods of time change underwear.  She has Mycolog at home for prn use.    Rx:  Diflucan 150 mg X 2   If no better by first of week will have her see MD  RV prn

## 2014-09-25 NOTE — Telephone Encounter (Signed)
Spoke with patient. Patient states "I need to be seen today. I am in a lot of pain and miserable." Offered patient appointment at 11:30am with Dr.Lathrop but patient declines. Appointment scheduled for today at 1pm with Deborah Mercado, [place]. Patient is agreeable to date and time.  Routing to provider for final review. Patient agreeable to disposition. Will close encounter

## 2014-09-27 ENCOUNTER — Ambulatory Visit: Payer: 59 | Admitting: Nurse Practitioner

## 2014-09-27 ENCOUNTER — Telehealth: Payer: Self-pay | Admitting: Nurse Practitioner

## 2014-09-27 ENCOUNTER — Encounter: Payer: Self-pay | Admitting: Nurse Practitioner

## 2014-09-27 ENCOUNTER — Ambulatory Visit (INDEPENDENT_AMBULATORY_CARE_PROVIDER_SITE_OTHER): Payer: 59 | Admitting: Nurse Practitioner

## 2014-09-27 ENCOUNTER — Telehealth: Payer: Self-pay

## 2014-09-27 VITALS — BP 136/84 | HR 76 | Ht 64.5 in | Wt 191.0 lb

## 2014-09-27 DIAGNOSIS — K649 Unspecified hemorrhoids: Secondary | ICD-10-CM

## 2014-09-27 MED ORDER — PRAMOXINE HCL 1 % RE FOAM: 1.0000 "application " | Freq: Three times a day (TID) | RECTAL | Status: DC | PRN

## 2014-09-27 NOTE — Progress Notes (Signed)
Subjective:     Patient ID: Deborah Mercado, female   DOB: [birth year], 63 y.o.   MRN: [id number]  HPI  This 63 yo Fe complains of rectal discomfort after a fairly loose BM 2 days ago.  The hemorrhoid is tender but not bleeding.  She has done a bath and bathed at the same time and felt like the soap caused even more irritation.  History of hemorrhoids since her daughter was born.  Only rare flares.  Bm's now are normal without pain.  Denies abdominal pain, GERD.  The yeast problems with the external genitalia are much better.   Review of Systems  Constitutional: Negative for fever, chills and fatigue.  Respiratory: Negative.   Cardiovascular: Negative.   Gastrointestinal: Positive for diarrhea and rectal pain. Negative for nausea, vomiting, abdominal pain, blood in stool and anal bleeding.  Genitourinary: Negative.  Negative for dysuria, urgency, frequency, hematuria, flank pain, vaginal bleeding, vaginal discharge, vaginal pain and pelvic pain.  Musculoskeletal: Negative.   Skin: Negative.   Neurological: Negative.   Psychiatric/Behavioral: Negative.        Objective:   Physical Exam  Constitutional: She is oriented to person, place, and time. She appears well-developed and well-nourished. No distress.  Abdominal: She exhibits no distension and no mass. There is no tenderness. There is no rebound and no guarding.  Genitourinary:  Areas of recent irritation across the vulva present on Tuesday is almost gone.  The rectum does have a hemorrhoid that is swollen and slight tender.   It is not thrombosed and no bleeding.  It can be put back up with only gentle pressure.  It did come back out with change of positions.  Musculoskeletal: Normal range of motion.  Neurological: She is alert and oriented to person, place, and time.  Psychiatric: She has a normal mood and affect. Her behavior is normal. Judgment and thought content normal.       Assessment:     Yeast vulvar irritation almost  resolved Flare of hemorrhoid    Plan:     She is given Proctofoam HC to use TID prn until better She will use warm sitz baths with Aveeno prn.  No other soaps. Wanted a note for work

## 2014-09-27 NOTE — Telephone Encounter (Signed)
Routing to Patricia Rolen-Grubb, FNP for review and advise. 

## 2014-09-27 NOTE — Progress Notes (Signed)
Encounter reviewed by Dr. Brook Silva.  

## 2014-09-27 NOTE — Telephone Encounter (Signed)
Spoke with patient at time of incoming call. Patient states that she took diflucan on Wednesday and had an upset stomach after. States that shortly after she began to have rectal itching and discomfort. "Now I have a hemorrhoid that has come out." Patient denies any bleeding with bowel movement or any pain at this time. Patient is off work today and would like to come in to see Milford Cage, [place]. Appointment scheduled for today 10/29 at 11:15am with Milford Cage, [place]. Patient is agreeable to date and time.  Routing to provider for final review. Patient agreeable to disposition. Will close encounter

## 2014-09-27 NOTE — Telephone Encounter (Signed)
Pharmacy called during lunch asking if we could switch medication pramoxine (PROCTOFOAM) 1 % foam to the combination with hydrocortisone. Pharmacy states the original med is considers OTC and insurance will not cover it will cost pt $75. Insurance will cover the combo for $25.

## 2014-09-27 NOTE — Telephone Encounter (Signed)
Disregard last message. Denyse Dago, CMA spoke with pharmacist and has changed prescription per Milford Cage, FNP's verbal order.   Routing to provider for final review. Patient agreeable to disposition. Will close encounter

## 2014-09-27 NOTE — Patient Instructions (Signed)

## 2014-10-01 ENCOUNTER — Encounter: Payer: Self-pay | Admitting: Nurse Practitioner

## 2014-10-01 NOTE — Progress Notes (Signed)
Encounter reviewed by Dr. Brook Silva.  

## 2014-11-30 DIAGNOSIS — Z923 Personal history of irradiation: Secondary | ICD-10-CM

## 2014-11-30 HISTORY — DX: Personal history of irradiation: Z92.3

## 2015-02-25 ENCOUNTER — Other Ambulatory Visit: Payer: Self-pay | Admitting: Nurse Practitioner

## 2015-02-25 NOTE — Telephone Encounter (Signed)
Medication refill request: Vesicare 5 mg Last AEX:  02/19/14 PG Next AEX:  03/18/15 PG Last MMG (if hormonal medication request): 08/07/13 [id number]:Neg Refill authorized: 02/19/14 #[id number]. Today please advise

## 2015-03-18 ENCOUNTER — Other Ambulatory Visit: Payer: Self-pay

## 2015-03-18 ENCOUNTER — Ambulatory Visit (INDEPENDENT_AMBULATORY_CARE_PROVIDER_SITE_OTHER): Payer: 59 | Admitting: Nurse Practitioner

## 2015-03-18 ENCOUNTER — Encounter: Payer: Self-pay | Admitting: Nurse Practitioner

## 2015-03-18 VITALS — BP 120/78 | HR 72 | Ht 64.25 in | Wt 193.0 lb

## 2015-03-18 DIAGNOSIS — Z1211 Encounter for screening for malignant neoplasm of colon: Secondary | ICD-10-CM | POA: Diagnosis not present

## 2015-03-18 DIAGNOSIS — Z0000 Encounter for general adult medical examination without abnormal findings: Secondary | ICD-10-CM

## 2015-03-18 DIAGNOSIS — Z01419 Encounter for gynecological examination (general) (routine) without abnormal findings: Secondary | ICD-10-CM

## 2015-03-18 DIAGNOSIS — Z1231 Encounter for screening mammogram for malignant neoplasm of breast: Secondary | ICD-10-CM

## 2015-03-18 LAB — TSH: TSH: 1.315 u[IU]/mL (ref 0.350–4.500)

## 2015-03-18 LAB — COMPREHENSIVE METABOLIC PANEL
ALT: 10 U/L (ref 0–35)
AST: 15 U/L (ref 0–37)
Albumin: 4 g/dL (ref 3.5–5.2)
Alkaline Phosphatase: 68 U/L (ref 39–117)
BILIRUBIN TOTAL: 0.8 mg/dL (ref 0.2–1.2)
BUN: 8 mg/dL (ref 6–23)
CALCIUM: 8.7 mg/dL (ref 8.4–10.5)
CHLORIDE: 108 meq/L (ref 96–112)
CO2: 24 meq/L (ref 19–32)
Creat: 0.76 mg/dL (ref 0.50–1.10)
GLUCOSE: 87 mg/dL (ref 70–99)
Potassium: 3.9 mEq/L (ref 3.5–5.3)
Sodium: 142 mEq/L (ref 135–145)
TOTAL PROTEIN: 6.8 g/dL (ref 6.0–8.3)

## 2015-03-18 LAB — LIPID PANEL
CHOLESTEROL: 166 mg/dL (ref 0–200)
HDL: 49 mg/dL (ref >=46)
LDL Cholesterol: 105 mg/dL — ABNORMAL HIGH (ref 0–99)
Total CHOL/HDL Ratio: 3.4 Ratio
Triglycerides: 61 mg/dL (ref <150)
VLDL: 12 mg/dL (ref 0–40)

## 2015-03-18 LAB — POCT URINALYSIS DIPSTICK
Bilirubin, UA: NEGATIVE
Blood, UA: NEGATIVE
Glucose, UA: NEGATIVE
Ketones, UA: NEGATIVE
Leukocytes, UA: NEGATIVE
NITRITE UA: NEGATIVE
PH UA: 7
Protein, UA: NEGATIVE
Urobilinogen, UA: NEGATIVE

## 2015-03-18 IMAGING — CT CT KNEE*L* W/O CM
3 of 4 series · 14 of 33 positions shown, 17 images · non-contrast
Comparison: 04/10/2013

CLINICAL DATA: Fall 2 days ago.  Anterior knee pain.  Query
patellar fracture

CT OF THE LEFT KNEE WITHOUT CONTRAST
TECHNIQUE: Multidetector CT imaging was performed according to the
standard protocol. Multiplanar CT image reconstructions were also
generated.

[Series 4: knee 2.0 b31s · axial · 0.29mm/px · z∈[-216,-46]mm · 8 of 101 slices shown, 10 images]
[im 8/101  soft-tissue]
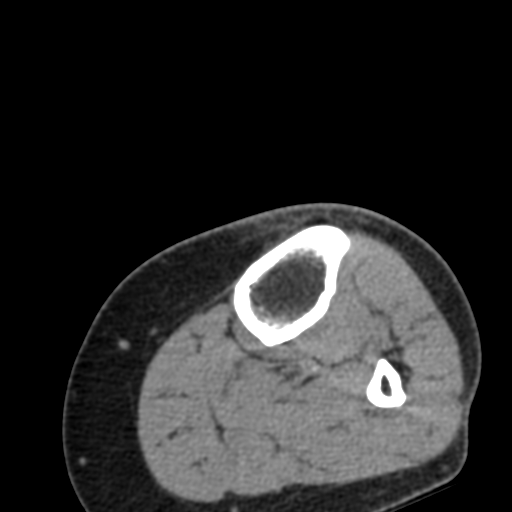
[im 8/101  bone]
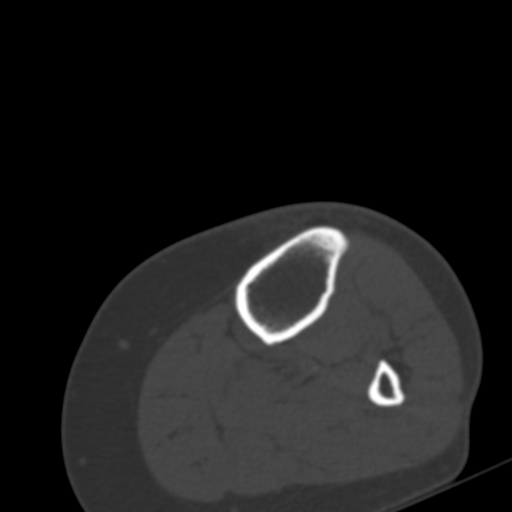
[im 24/101  bone]
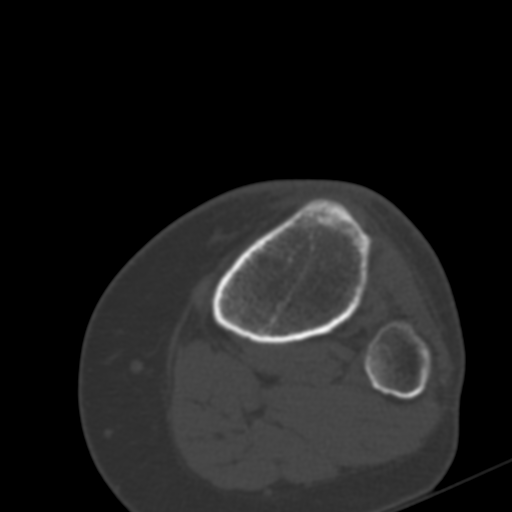
[im 31/101  bone]
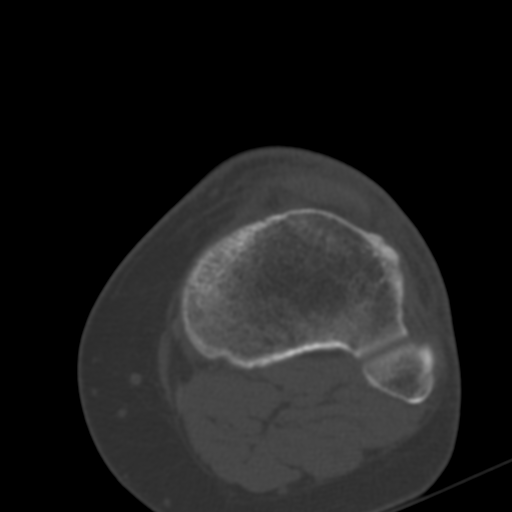
[im 47/101  bone]
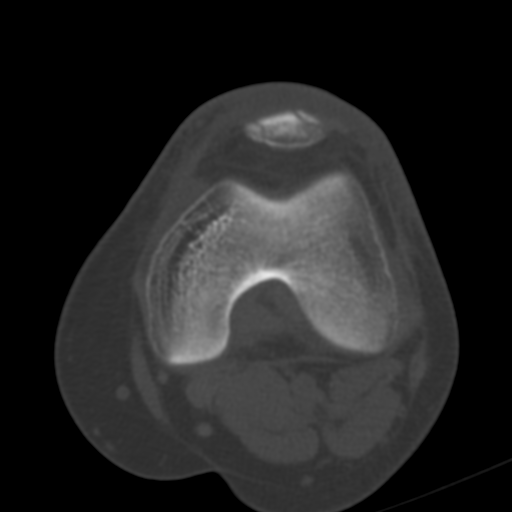
[im 54/101  soft-tissue]
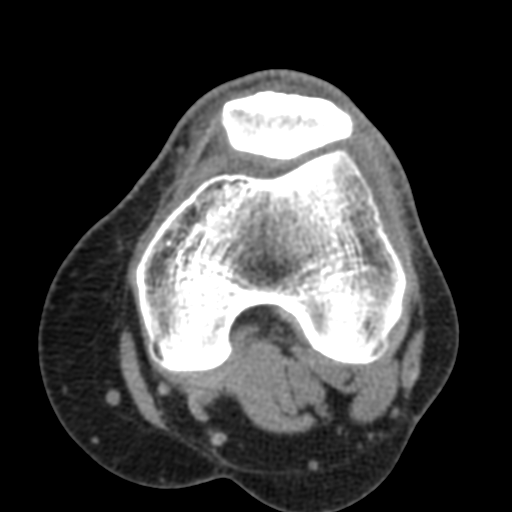
[im 54/101  bone]
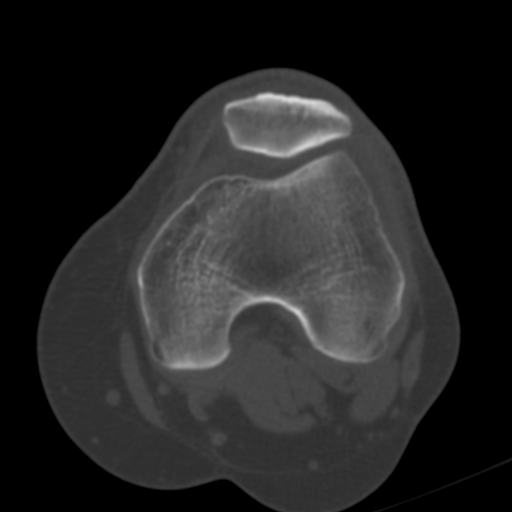
[im 70/101  bone]
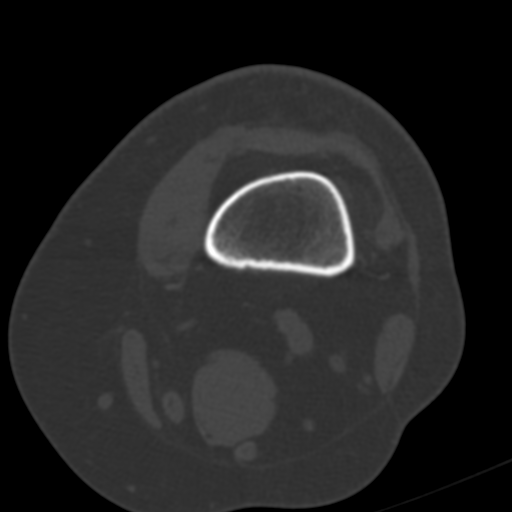
[im 77/101  bone]
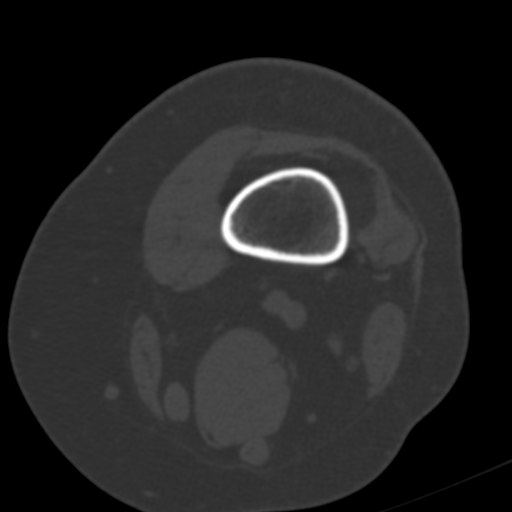
[im 93/101  bone]
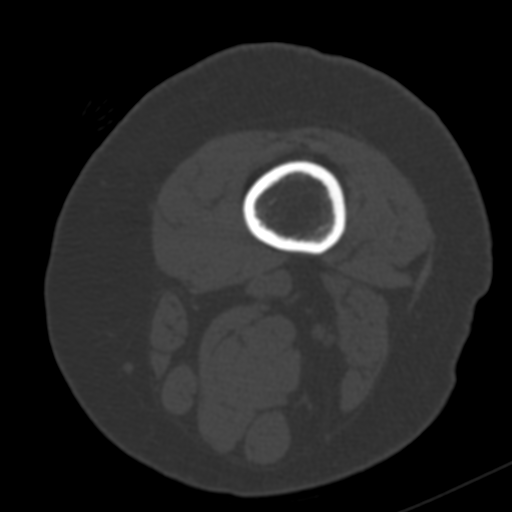

[Series 5: knee 2.0 coronal · coronal · 0.29mm/px · 1 of 68 slices shown]
[im 34/68  bone]
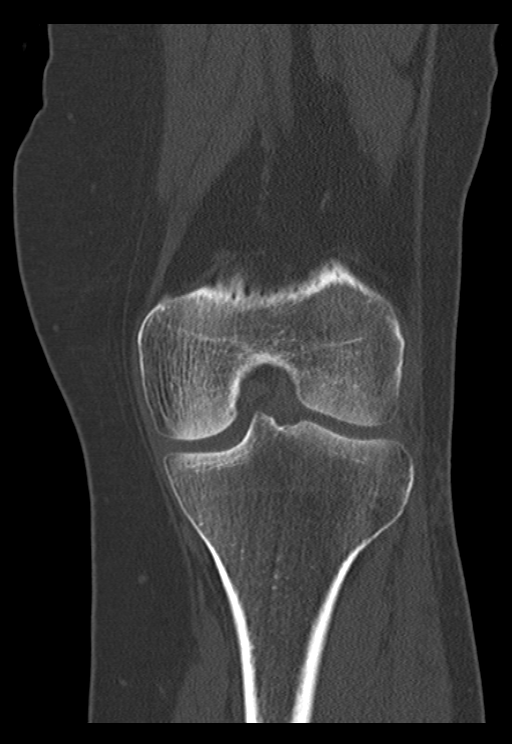

[Series 9: knee 2.0 sagittal · sagittal · 0.32mm/px · 5 of 68 slices shown, 6 images]
[im 23/68  bone]
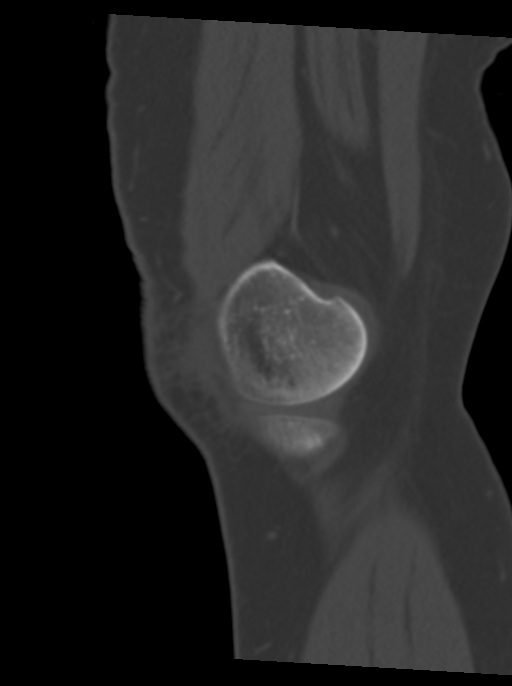
[im 28/68  bone]
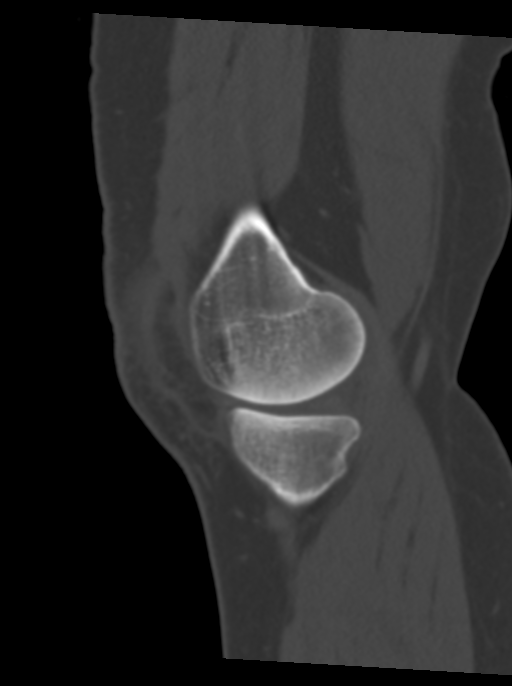
[im 34/68  soft-tissue]
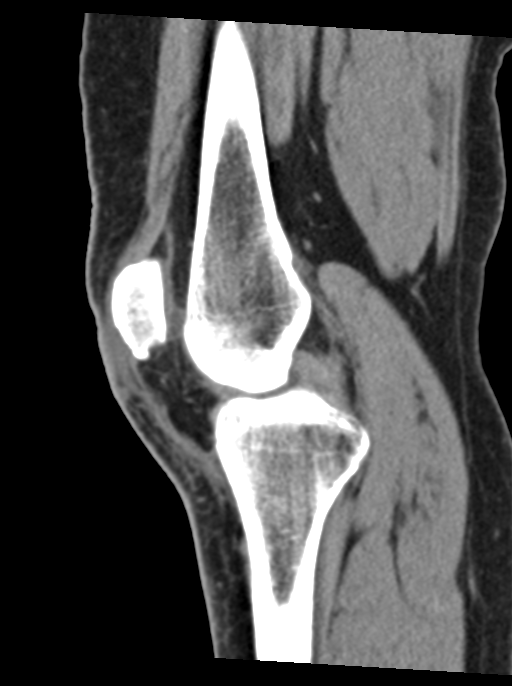
[im 34/68  bone]
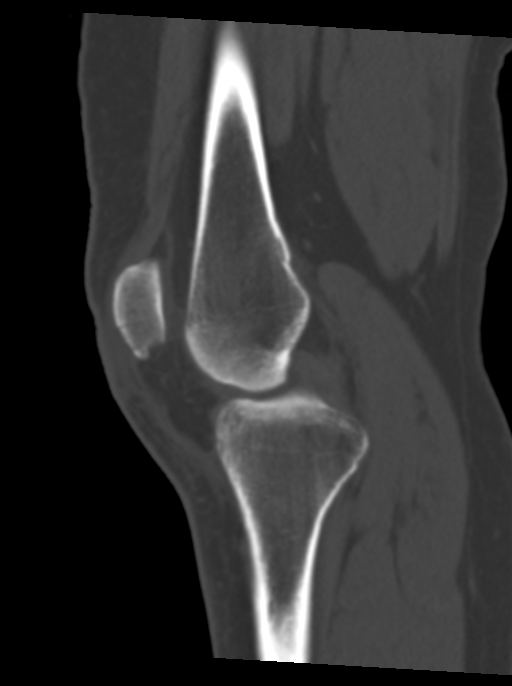
[im 40/68  bone]
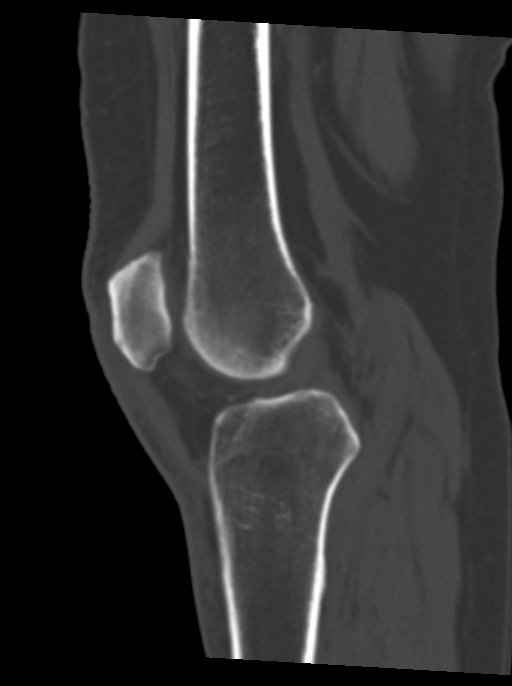
[im 45/68  bone]
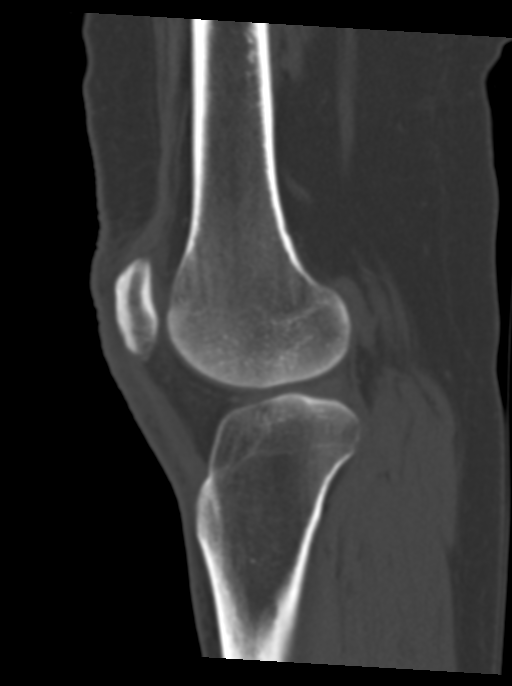

[14 of 33 positions shown; findings below may reference images not displayed]

FINDINGS: Transverse nondisplaced fracture through the inferior
pole of the patella observed.  Trace knee effusion noted with
prepatellar edema and mild expansion of the patellar tendon.

No additional fracture or discrete at additional abnormal findings.
IMPRESSION: 1.  Transverse nondisplaced fracture of the inferior pole of the
patella.
2.  Expansion of the adjacent patellar tendon centrally, probably
from tendinopathy.  There is also prepatellar subcutaneous edema.

3.  Trace knee effusion.

## 2015-03-18 MED ORDER — VITAMIN D (ERGOCALCIFEROL) 1.25 MG (50000 UNIT) PO CAPS: 50000.0000 [IU] | ORAL_CAPSULE | ORAL | Status: DC

## 2015-03-18 MED ORDER — SOLIFENACIN SUCCINATE 5 MG PO TABS: 5.0000 mg | ORAL_TABLET | Freq: Every day | ORAL | Status: DC

## 2015-03-18 MED ORDER — PRAMOXINE HCL 1 % RE FOAM: 1.0000 "application " | Freq: Three times a day (TID) | RECTAL | Status: DC | PRN

## 2015-03-18 MED ORDER — ESTRADIOL 0.5 MG PO TABS: 0.5000 mg | ORAL_TABLET | Freq: Every day | ORAL | Status: DC

## 2015-03-18 MED ORDER — NYSTATIN-TRIAMCINOLONE 100000-0.1 UNIT/GM-% EX OINT: 1.0000 "application " | TOPICAL_OINTMENT | Freq: Two times a day (BID) | CUTANEOUS | Status: DC

## 2015-03-18 MED ORDER — PANTOPRAZOLE SODIUM 20 MG PO TBEC: 20.0000 mg | DELAYED_RELEASE_TABLET | Freq: Every day | ORAL | Status: DC

## 2015-03-18 NOTE — Progress Notes (Signed)
Patient ID: Deborah Mercado, female   DOB: [birth year], 64 y.o.   MRN: [id number] 64 y.o. G1P1001 Legally Separated  Caucasian Fe here for annual exam.  No new health problems.  Not dating or SA.  Tried to taper Estradiol 1 mg but the tablet is too small.  Not current on Mammo - she will get done.  Patient's last menstrual period was 11/30/2004.          Sexually active: No.  The current method of family planning is abstinence and post menopausal status.    Exercising: No.  The patient does not participate in regular exercise at present. Smoker:  no  Health Maintenance: Pap:  12/02/12, negative with neg HR HPV MMG:  08/07/13, Bi-Rads 1:  Negative Colonoscopy:  Never  BMD:   Never  TDaP:  2009 Labs:  HB:  13.3  Urine:  Negative    reports that she has never smoked. She has never used smokeless tobacco. She reports that she does not drink alcohol or use illicit drugs.  Past Medical History  Diagnosis Date  . Vitamin D deficiency   . SUI (stress urinary incontinence, female)     History reviewed. No pertinent past surgical history.  Current Outpatient Prescriptions  Medication Sig Dispense Refill  . nystatin-triamcinolone ointment (MYCOLOG) Apply 1 application topically 2 (two) times daily. 30 g 2  . pantoprazole (PROTONIX) 20 MG tablet Take 1 tablet (20 mg total) by mouth daily. 90 tablet 3  . pramoxine (PROCTOFOAM) 1 % foam Place 1 application rectally 3 (three) times daily as needed for itching. 15 g 1  . solifenacin (VESICARE) 5 MG tablet Take 1 tablet (5 mg total) by mouth daily. 90 tablet 3  . Vitamin D, Ergocalciferol, (DRISDOL) 50000 UNITS CAPS capsule Take 1 capsule (50,000 Units total) by mouth every 7 (seven) days. 30 capsule 3   No current facility-administered medications for this visit.    Family History  Problem Relation Age of Onset  . Heart disease Mother   . Rheum arthritis Mother   . Hypertension Mother   . Rheum arthritis Maternal Grandmother   . Heart  attack Maternal Grandmother   . Heart disease Maternal Grandmother     ROS:  Pertinent items are noted in HPI.  Otherwise, a comprehensive ROS was negative.  Exam:   BP 120/78 mmHg  Pulse 72  Ht 5' 4.25" (1.632 m)  Wt 193 lb (87.544 kg)  BMI 32.87 kg/m2  LMP 11/30/2004 Height: 5' 4.25" (163.2 cm) Ht Readings from Last 3 Encounters:  03/18/15 5' 4.25" (1.632 m)  09/27/14 5' 4.5" (1.638 m)  09/25/14 5' 4.5" (1.638 m)    General appearance: alert, cooperative and appears stated age Head: Normocephalic, without obvious abnormality, atraumatic Neck: no adenopathy, supple, symmetrical, trachea midline and thyroid normal to inspection and palpation Lungs: clear to auscultation bilaterally Breasts: normal appearance, no masses or tenderness Heart: regular rate and rhythm Abdomen: soft, non-tender; no masses,  no organomegaly Extremities: extremities normal, atraumatic, no cyanosis or edema Skin: Skin color, texture, turgor normal. No rashes or lesions Lymph nodes: Cervical, supraclavicular, and axillary nodes normal. No abnormal inguinal nodes palpated Neurologic: Grossly normal   Pelvic: External genitalia:  no lesions              Urethra:  normal appearing urethra with no masses, tenderness or lesions              Bartholin's and Skene's: normal  Vagina: normal appearing vagina with normal color and discharge, no lesions              Cervix: anteverted              Pap taken: No. Bimanual Exam:  Uterus:  normal size, contour, position, consistency, mobility, non-tender              Adnexa: no mass, fullness, tenderness               Rectovaginal: Confirms               Anus:  normal sphincter tone, no lesions  Chaperone present:  no  A:  Well Woman with normal exam  Postmenopausal on HRT SUI doing well on Vesicare History of Vit D deficiency History of GERD - with recent flare   P:   Reviewed health and wellness  pertinent to exam  Pap smear not taken today  Mammogram is past due and no hormones will be refilled at this time  Counseled with risk of DVT, CVA, cancer, etc.  Follow with labs  IFOB is given  Note is given for work due to frequent bathroom breaks  Counseled on breast self exam, mammography screening, use and side effects of HRT, adequate intake of calcium and vitamin D, diet and exercise return annually or prn  An After Visit Summary was printed and given to the patient.

## 2015-03-18 NOTE — Patient Instructions (Signed)

## 2015-03-19 LAB — VITAMIN D 25 HYDROXY (VIT D DEFICIENCY, FRACTURES): Vit D, 25-Hydroxy: 22 ng/mL — ABNORMAL LOW (ref 30–100)

## 2015-03-21 LAB — HEMOGLOBIN, FINGERSTICK: HEMOGLOBIN, FINGERSTICK: 13.3 g/dL (ref 12.0–16.0)

## 2015-03-21 NOTE — Progress Notes (Signed)
Encounter reviewed by Dr. Brook Silva.  

## 2015-03-25 ENCOUNTER — Ambulatory Visit
Admission: RE | Admit: 2015-03-25 | Discharge: 2015-03-25 | Disposition: A | Discharge status: Home / Self Care | Payer: 59 | Source: Ambulatory Visit

## 2015-03-25 DIAGNOSIS — Z1231 Encounter for screening mammogram for malignant neoplasm of breast: Secondary | ICD-10-CM

## 2015-03-26 ENCOUNTER — Other Ambulatory Visit: Payer: Self-pay | Admitting: Nurse Practitioner

## 2015-03-26 DIAGNOSIS — R928 Other abnormal and inconclusive findings on diagnostic imaging of breast: Secondary | ICD-10-CM

## 2015-04-01 ENCOUNTER — Ambulatory Visit
Admission: RE | Admit: 2015-04-01 | Discharge: 2015-04-01 | Disposition: A | Discharge status: Home / Self Care | Payer: 59 | Source: Ambulatory Visit | Attending: Nurse Practitioner | Admitting: Nurse Practitioner

## 2015-04-01 ENCOUNTER — Other Ambulatory Visit: Payer: Self-pay | Admitting: Nurse Practitioner

## 2015-04-01 DIAGNOSIS — N6310 Unspecified lump in the right breast, unspecified quadrant: Secondary | ICD-10-CM

## 2015-04-01 DIAGNOSIS — R928 Other abnormal and inconclusive findings on diagnostic imaging of breast: Secondary | ICD-10-CM

## 2015-04-01 DIAGNOSIS — C50919 Malignant neoplasm of unspecified site of unspecified female breast: Secondary | ICD-10-CM

## 2015-04-01 HISTORY — DX: Malignant neoplasm of unspecified site of unspecified female breast: C50.919

## 2015-04-01 HISTORY — PX: BREAST BIOPSY: SHX20

## 2015-04-04 ENCOUNTER — Other Ambulatory Visit: Payer: 59

## 2015-04-04 ENCOUNTER — Telehealth: Payer: Self-pay | Admitting: Nurse Practitioner

## 2015-04-04 ENCOUNTER — Other Ambulatory Visit: Payer: Self-pay | Admitting: *Deleted

## 2015-04-04 ENCOUNTER — Telehealth: Payer: Self-pay | Admitting: *Deleted

## 2015-04-04 DIAGNOSIS — C50411 Malignant neoplasm of upper-outer quadrant of right female breast: Secondary | ICD-10-CM | POA: Insufficient documentation

## 2015-04-04 DIAGNOSIS — Z170 Estrogen receptor positive status [ER+]: Secondary | ICD-10-CM

## 2015-04-04 NOTE — Telephone Encounter (Signed)
Patient wants to speak with the nurse. No information given. °

## 2015-04-04 NOTE — Telephone Encounter (Signed)
Spoke with patient. Patient calling to let Deborah Cage, FNP know that she has been diagnosed with breast cancer of her right breast. "I was not having any problems and went in for my mammogram and they said it is a place about the size of a bb. They told me I should let your office know and that I may need to stop taking my estrogen." Advised patient take she will need to stop taking estrogen and okay to stop taking provera at this time as well. Patient is agreeable. "Is it okay for me to continue my Vesicare?" Advised she may continue with taking her Vesicare at this time. Will let Deborah Cage, FNP know of new diagnosis and if she has any further recommendations regarding follow up or medication regimen will return call. Patient is agreeable. Patient will call if she needs anything during this time.  Deborah Cage, FNP any further recommendations for this patient?

## 2015-04-04 NOTE — Telephone Encounter (Signed)
Confirmed [facility] for 04/10/15 at 12N .  Instructions and contact information given.

## 2015-04-05 NOTE — Telephone Encounter (Signed)
Pt. Is called back about new diagnosis of breast cancer.  She is informed that she must come off all HRT.  She is going to be seen on 04/10/15 and will follow with recommendations.  Support is given.

## 2015-04-10 ENCOUNTER — Other Ambulatory Visit: Payer: Self-pay | Admitting: General Surgery

## 2015-04-10 ENCOUNTER — Encounter: Payer: Self-pay | Admitting: Oncology

## 2015-04-10 ENCOUNTER — Ambulatory Visit
Admission: RE | Admit: 2015-04-10 | Discharge: 2015-04-10 | Disposition: A | Discharge status: Home / Self Care | Payer: 59 | Source: Ambulatory Visit | Attending: Radiation Oncology | Admitting: Radiation Oncology

## 2015-04-10 ENCOUNTER — Ambulatory Visit: Payer: 59

## 2015-04-10 ENCOUNTER — Other Ambulatory Visit (HOSPITAL_BASED_OUTPATIENT_CLINIC_OR_DEPARTMENT_OTHER): Payer: 59

## 2015-04-10 ENCOUNTER — Ambulatory Visit: Payer: 59 | Attending: General Surgery | Admitting: Physical Therapy

## 2015-04-10 ENCOUNTER — Ambulatory Visit (HOSPITAL_BASED_OUTPATIENT_CLINIC_OR_DEPARTMENT_OTHER): Payer: 59 | Admitting: Oncology

## 2015-04-10 ENCOUNTER — Encounter: Payer: Self-pay | Admitting: Physical Therapy

## 2015-04-10 VITALS — BP 158/77 | HR 71 | Temp 98.4°F | Resp 18 | Ht 64.25 in | Wt 190.7 lb

## 2015-04-10 DIAGNOSIS — C50411 Malignant neoplasm of upper-outer quadrant of right female breast: Secondary | ICD-10-CM

## 2015-04-10 DIAGNOSIS — R293 Abnormal posture: Secondary | ICD-10-CM | POA: Insufficient documentation

## 2015-04-10 DIAGNOSIS — Z170 Estrogen receptor positive status [ER+]: Secondary | ICD-10-CM | POA: Diagnosis not present

## 2015-04-10 DIAGNOSIS — C50211 Malignant neoplasm of upper-inner quadrant of right female breast: Secondary | ICD-10-CM | POA: Diagnosis present

## 2015-04-10 LAB — CBC WITH DIFFERENTIAL/PLATELET
BASO%: 0.7 % (ref 0.0–2.0)
Basophils Absolute: 0 10*3/uL (ref 0.0–0.1)
EOS ABS: 0.1 10*3/uL (ref 0.0–0.5)
EOS%: 1.4 % (ref 0.0–7.0)
HEMATOCRIT: 41.8 % (ref 34.8–46.6)
HEMOGLOBIN: 13.9 g/dL (ref 11.6–15.9)
LYMPH%: 27.8 % (ref 14.0–49.7)
MCH: 28.7 pg (ref 25.1–34.0)
MCHC: 33.3 g/dL (ref 31.5–36.0)
MCV: 86.2 fL (ref 79.5–101.0)
MONO#: 0.4 10*3/uL (ref 0.1–0.9)
MONO%: 5.9 % (ref 0.0–14.0)
NEUT%: 64.2 % (ref 38.4–76.8)
NEUTROS ABS: 4.3 10*3/uL (ref 1.5–6.5)
Platelets: 200 10*3/uL (ref 145–400)
RBC: 4.85 10*6/uL (ref 3.70–5.45)
RDW: 13.6 % (ref 11.2–14.5)
WBC: 6.8 10*3/uL (ref 3.9–10.3)
lymph#: 1.9 10*3/uL (ref 0.9–3.3)

## 2015-04-10 LAB — COMPREHENSIVE METABOLIC PANEL (CC13)
ALT: 11 U/L (ref 0–55)
ANION GAP: 8 meq/L (ref 3–11)
AST: 20 U/L (ref 5–34)
Albumin: 3.9 g/dL (ref 3.5–5.0)
Alkaline Phosphatase: 84 U/L (ref 40–150)
BILIRUBIN TOTAL: 0.69 mg/dL (ref 0.20–1.20)
BUN: 8 mg/dL (ref 7.0–26.0)
CO2: 25 meq/L (ref 22–29)
CREATININE: 0.8 mg/dL (ref 0.6–1.1)
Calcium: 9 mg/dL (ref 8.4–10.4)
Chloride: 110 mEq/L — ABNORMAL HIGH (ref 98–109)
EGFR: 76 mL/min/{1.73_m2} — AB (ref >90)
Glucose: 91 mg/dl (ref 70–140)
Potassium: 4.2 mEq/L (ref 3.5–5.1)
Sodium: 143 mEq/L (ref 136–145)
Total Protein: 7.4 g/dL (ref 6.4–8.3)

## 2015-04-10 NOTE — Progress Notes (Signed)
Ms. Deborah Mercado is a very pleasant 64 y.o. 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  Biopsy results revealed the tumor's prognostic profile is ER positive, PR positive, and HER2/neu negative. Ki67 is 5%.  She presents today with her daughter, named Deborah Mercado, to the [facility]) for treatment consideration and recommendations from the breast surgeon, radiation oncologist, and medical oncologist.     I briefly met with [name] and her daughter during her [facility] visit today. We discussed the purpose of the Survivorship Clinic, which will include monitoring for recurrence, coordinating completion of age and gender-appropriate cancer screenings, promotion of overall wellness, as well as managing potential late/long-term side effects of anti-cancer treatments.    The treatment plan for [name] will likely include surgery, radiation therapy, and anti-estrogen therapy.  As of today, the intent of treatment for [name] is cure, therefore she will be eligible for the Survivorship Clinic upon her completion of treatment.  Her survivorship care plan (SCP) document will be drafted and updated throughout the course of her treatment trajectory. She will receive the SCP in an office visit with myself in the Survivorship Clinic once she has completed treatment.   [name] was encouraged to ask questions and all questions were answered to her satisfaction.  She was given my business card and encouraged to contact me with any concerns regarding survivorship.  I look forward to participating in her care.   Deborah Craze, NP [place] [phone]

## 2015-04-10 NOTE — Progress Notes (Signed)
Checked in new pt with no financial concerns prior to seeing the dr.  Informed pt if chemo is part of her treatment we will call her ins to see if auth is required and will obtain it if it is as well as contact foundations that offer copay assistance for chemo if needed.  She has my card for any billing questions or concerns. °

## 2015-04-10 NOTE — Progress Notes (Signed)
Radiation Oncology         [phone] ________________________________  Initial outpatient Consultation  Name: Deborah Mercado MRN: [id number]  Date: 04/10/2015  DOB: 03-16-1951  QP:[id number], Jaymes Graff, MD  Jovita Kussmaul, MD   REFERRING PHYSICIAN: Jovita Kussmaul, MD  DIAGNOSIS:    ICD-9-CM ICD-10-CM   1. Breast cancer of upper-outer quadrant of right female breast 174.4 C50.411    Clinical T1b N0 M0, Stage 1 invasive ductal carcinoma of the right breast, upper inner quadrant, ER PR positive, HER-2 negative   HISTORY OF PRESENT ILLNESS::Deborah Mercado is a 64 y.o. female who presented with a possible mass in the right breast on screening mammography on 03/25/15. This mass was 6 mm on ultrasound, located in the upper inner quadrant of the right breast. Biopsy on 04/01/15 revealed invasive ductal carcinoma, Grade 1, ER/ PR positive, HER-2 negative. She has quit taking hormone replacement as of 04/05/15.   She reports that she has never had cancer before.  She lives in [place] and currently works at [employer].  She has been having night sweats since stopping HRT.  PREVIOUS RADIATION THERAPY: No  PAST MEDICAL HISTORY:  has a past medical history of Vitamin D deficiency and SUI (stress urinary incontinence, female).    PAST SURGICAL HISTORY:No past surgical history on file.  FAMILY HISTORY: family history includes Heart attack in her maternal grandmother; Heart disease in her maternal grandmother and mother; Hypertension in her mother; Rheum arthritis in her maternal grandmother and mother.  SOCIAL HISTORY:  reports that she has never smoked. She has never used smokeless tobacco. She reports that she does not drink alcohol or use illicit drugs.  ALLERGIES: Influenza vaccines and Monistat  MEDICATIONS:  Current Outpatient Prescriptions  Medication Sig Dispense Refill  . pantoprazole (PROTONIX) 20 MG tablet Take 1 tablet (20 mg total) by mouth daily. 90  tablet 3  . pramoxine (PROCTOFOAM) 1 % foam Place 1 application rectally 3 (three) times daily as needed for itching. (Patient not taking: Reported on 04/10/2015) 15 g 1  . solifenacin (VESICARE) 5 MG tablet Take 1 tablet (5 mg total) by mouth daily. 90 tablet 3  . Vitamin D, Ergocalciferol, (DRISDOL) 50000 UNITS CAPS capsule Take 1 capsule (50,000 Units total) by mouth every 7 (seven) days. 30 capsule 3   No current facility-administered medications for this encounter.    REVIEW OF SYSTEMS:  Notable for that above.   PHYSICAL EXAM:  vitals were not taken for this visit.  General: Alert and oriented, in no acute distress HEENT: Head is normocephalic. Extraocular movements are intact. Oropharynx is clear. Neck: Neck is supple, no palpable cervical or supraclavicular lymphadenopathy. Heart: Regular in rate and rhythm with no murmurs, rubs, or gallops. Chest: Clear to auscultation bilaterally, with no rhonchi, wheezes, or rales. Abdomen: Soft, nontender, nondistended, with no rigidity or guarding.  Extremities: No cyanosis or edema. Lymphatics: see Neck Exam Skin: No concerning lesions. Musculoskeletal: symmetric strength and muscle tone throughout. Neurologic: Cranial nerves II through XII are grossly intact. No obvious focalities. Speech is fluent. Coordination is intact. Psychiatric: Judgment and insight are intact. Affect is appropriate. Breast: Right breast with post biopsy bruising around the 12 o'clock position. Tender on palpation. Can not appreciate an obvious mass. No palpable lymph adenopathy in the right axilla. Did not find anything on palpation of the left breast or left axilla.    ECOG = 0  0 - Asymptomatic (Fully active, able to carry  on all predisease activities without restriction)  1 - Symptomatic but completely ambulatory (Restricted in physically strenuous activity but ambulatory and able to carry out work of a light or sedentary nature. For example, light housework,  office work)  2 - Symptomatic, <50% in bed during the day (Ambulatory and capable of all self care but unable to carry out any work activities. Up and about more than 50% of waking hours)  3 - Symptomatic, >50% in bed, but not bedbound (Capable of only limited self-care, confined to bed or chair 50% or more of waking hours)  4 - Bedbound (Completely disabled. Cannot carry on any self-care. Totally confined to bed or chair)  5 - Death   Eustace Pen MM, Creech RH, Tormey DC, et al. [phone]). "Toxicity and response criteria of the [facility]". [place] Oncol. 5 (6): 649-55   LABORATORY DATA:  Lab Results  Component Value Date   WBC 6.8 04/10/2015   HGB 13.9 04/10/2015   HCT 41.8 04/10/2015   MCV 86.2 04/10/2015   PLT 200 04/10/2015   CMP     Component Value Date/Time   NA 143 04/10/2015 1225   NA 142 03/18/2015 1454   K 4.2 04/10/2015 1225   K 3.9 03/18/2015 1454   CL 108 03/18/2015 1454   CO2 25 04/10/2015 1225   CO2 24 03/18/2015 1454   GLUCOSE 91 04/10/2015 1225   GLUCOSE 87 03/18/2015 1454   BUN 8.0 04/10/2015 1225   BUN 8 03/18/2015 1454   CREATININE 0.8 04/10/2015 1225   CREATININE 0.76 03/18/2015 1454   CALCIUM 9.0 04/10/2015 1225   CALCIUM 8.7 03/18/2015 1454   PROT 7.4 04/10/2015 1225   PROT 6.8 03/18/2015 1454   ALBUMIN 3.9 04/10/2015 1225   ALBUMIN 4.0 03/18/2015 1454   AST 20 04/10/2015 1225   AST 15 03/18/2015 1454   ALT 11 04/10/2015 1225   ALT 10 03/18/2015 1454   ALKPHOS 84 04/10/2015 1225   ALKPHOS 68 03/18/2015 1454   BILITOT 0.69 04/10/2015 1225   BILITOT 0.8 03/18/2015 1454         RADIOGRAPHY: Mm Digital Screening Bilateral  03/25/2015   CLINICAL DATA:  Screening.  EXAM: DIGITAL SCREENING BILATERAL MAMMOGRAM WITH CAD  COMPARISON:  Previous exam(s).  ACR Breast Density Category b: There are scattered areas of fibroglandular density.  FINDINGS: In the right breast, a possible mass warrants further evaluation. In the left  breast, no findings suspicious for malignancy. Images were processed with CAD.  IMPRESSION: Further evaluation is suggested for possible mass in the right breast.  RECOMMENDATION: Diagnostic mammogram and possibly ultrasound of the right breast. (Code:[id number])  The patient will be contacted regarding the findings, and additional imaging will be scheduled.  BI-RADS CATEGORY  0: Incomplete. Need additional imaging evaluation and/or prior mammograms for comparison.   Electronically Signed   By: Conchita Paris M.D.   On: 03/25/2015 14:21   US Breast Ltd Uni Right Inc Axilla  04/01/2015   CLINICAL DATA:  RECALL FROM SCREENING MAMMOGRAM.  EXAM: DIGITAL DIAGNOSTIC RIGHT MAMMOGRAM WITH 3D TOMOSYNTHESIS WITH CAD  ULTRASOUND RIGHT BREAST  COMPARISON:  Previous examinations the most recent which is dated 03/25/2015.  ACR Breast Density Category b: There are scattered areas of fibroglandular density.  FINDINGS: Additional views of the right breast demonstrate a small spiculated mass to be present within the central right breast at approximately the 11:30 o'clock position. This measures 7 mm in diameter.  Mammographic images were processed with  CAD.  On physical exam, there is no discrete palpable abnormality.  Targeted ultrasound is performed, showing an irregularly marginated hypoechoic mass located within the right breast at the 11:30 o'clock position 1 cm from the nipple posteriorly located within the breast. This measures 6 x 6 x 5 mm in size. This is suspicious for a small invasive mammary carcinoma.  Ultrasound of the right axilla demonstrates normal axillary contents and no evidence for adenopathy.  As this is better visualized mammographically, I have discussed stereotactic biopsy of this mass. The patient would like to proceed with this. This will be performed and reported separately.  IMPRESSION: 6 mm suspicious mass located within the right breast at the 11:30 o'clock position 1 cm from the nipple. Tissue  sampling is recommended. Stereotactic core biopsy will be performed and reported separately.  RECOMMENDATION: Right breast stereotactic core biopsy.  I have discussed the findings and recommendations with the patient. Results were also provided in writing at the conclusion of the visit. If applicable, a reminder letter will be sent to the patient regarding the next appointment.  BI-RADS CATEGORY  5: Highly suggestive of malignancy.   Electronically Signed   By: Altamese Cabal M.D.   On: 04/01/2015 13:51   Mm Diag Breast Tomo Uni Right  04/01/2015   CLINICAL DATA:  Post right breast stereotactic biopsy.  EXAM: DIAGNOSTIC RIGHT MAMMOGRAM POST STEREOTACTIC BIOPSY  COMPARISON:  Previous exam(s).  FINDINGS: Mammographic images were obtained following stereotactically guided biopsy of the small spiculated mass located within the central right breast. The X shaped clip is in appropriate position.  IMPRESSION: Appropriate position of clip following right breast stereotactic biopsy.  Final Assessment: Post Procedure Mammograms for Marker Placement   Electronically Signed   By: Altamese Cabal M.D.   On: 04/01/2015 14:51   Mm Diag Breast Tomo Uni Right  04/01/2015   CLINICAL DATA:  RECALL FROM SCREENING MAMMOGRAM.  EXAM: DIGITAL DIAGNOSTIC RIGHT MAMMOGRAM WITH 3D TOMOSYNTHESIS WITH CAD  ULTRASOUND RIGHT BREAST  COMPARISON:  Previous examinations the most recent which is dated 03/25/2015.  ACR Breast Density Category b: There are scattered areas of fibroglandular density.  FINDINGS: Additional views of the right breast demonstrate a small spiculated mass to be present within the central right breast at approximately the 11:30 o'clock position. This measures 7 mm in diameter.  Mammographic images were processed with CAD.  On physical exam, there is no discrete palpable abnormality.  Targeted ultrasound is performed, showing an irregularly marginated hypoechoic mass located within the right breast at the 11:30 o'clock  position 1 cm from the nipple posteriorly located within the breast. This measures 6 x 6 x 5 mm in size. This is suspicious for a small invasive mammary carcinoma.  Ultrasound of the right axilla demonstrates normal axillary contents and no evidence for adenopathy.  As this is better visualized mammographically, I have discussed stereotactic biopsy of this mass. The patient would like to proceed with this. This will be performed and reported separately.  IMPRESSION: 6 mm suspicious mass located within the right breast at the 11:30 o'clock position 1 cm from the nipple. Tissue sampling is recommended. Stereotactic core biopsy will be performed and reported separately.  RECOMMENDATION: Right breast stereotactic core biopsy.  I have discussed the findings and recommendations with the patient. Results were also provided in writing at the conclusion of the visit. If applicable, a reminder letter will be sent to the patient regarding the next appointment.  BI-RADS CATEGORY  5: Highly suggestive  of malignancy.   Electronically Signed   By: Altamese Cabal M.D.   On: 04/01/2015 13:51   Mm Rt Breast Bx W Loc Dev 1st Lesion Image Bx Spec Stereo Guide  04/03/2015   ADDENDUM REPORT: 04/03/2015 08:34  ADDENDUM: Pathology revealed grade I invasive ductal carcinoma in the right breast. This was found to be concordant by Dr. Luberta Robertson. Pathology was discussed with the patient by telephone. She reported doing well after the biopsy with tenderness at the site. Post biopsy instructions and care were reviewed and her questions were answered. She has been scheduled at [facility] on Apr 10, 2015. The patient is encouraged to come to [facility] for educational materials. My number was provided for future questions and concerns.  Pathology results reported by Susa Raring RN, BSN on Apr 03, 2015.   Electronically Signed   By: Altamese Cabal M.D.   On: 04/03/2015  08:34   04/03/2015   CLINICAL DATA:  Right breast mass.  EXAM: RIGHT BREAST STEREOTACTIC CORE NEEDLE BIOPSY  COMPARISON:  Previous exams.  FINDINGS: The patient and I discussed the procedure of stereotactic-guided biopsy including benefits and alternatives. We discussed the high likelihood of a successful procedure. We discussed the risks of the procedure including infection, bleeding, tissue injury, clip migration, and inadequate sampling. Informed written consent was given. The usual time out protocol was performed immediately prior to the procedure.  Using sterile technique and 2% lidocaine as local anesthetic, under stereotactic guidance, a 9 gauge vacuum assisted device was used to perform core needle biopsy of the mass located within the central right breast at the 11:30 o'clock subareolar position using a superior craniocaudal approach.  At the conclusion of the procedure, an X shaped tissue marker clip was deployed into the biopsy cavity. Follow-up 2-view mammogram was performed and dictated separately.  IMPRESSION: Stereotactic-guided biopsy of the small spiculated or mass located within the subareolar right breast at the 11:30 o'clock position as discussed above. No apparent complications.  Electronically Signed: By: Altamese Cabal M.D. On: 04/01/2015 14:50      IMPRESSION/PLAN: She has been discussed at our multidisciplinary tumor board.  The consensus is that she be a good candidate for breast conservation. I talked to her about the option of a mastectomy and informed her that her expected overall survival would be equivalent between mastectomy and breast conservation, based upon randomized controlled data. She is enthusiastic about breast conservation.  Multidisciplinary plan is for the patient to undergo lumpectomy and sentinel lymph node biopsy follow by Oncotype testing (if tumor >50m)  to determine if chemotherapy will follow. The next step will be radiotherapy to the right breast.  It was  a pleasure meeting the patient today. We discussed the risks, benefits, and side effects of radiotherapy. We discussed that radiation would take approximately 4- 6 weeks to complete and that I would give the patient a few weeks to heal following surgery before starting treatment planning. We spoke about acute effects including skin irritation and fatigue as well as much less common late effects including lung irritation. We spoke about the latest technology that is used to minimize the risk of late effects for breast cancer patients undergoing radiotherapy. No guarantees of treatment were given. The patient is enthusiastic about proceeding with treatment. I look forward to participating in the patient's care.  This document serves as a record of services personally performed by SEppie Gibson MD. It was created on  her behalf by Arlyce Harman, a trained medical scribe. The creation of this record is based on the scribe's personal observations and the provider's statements to them. This document has been checked and approved by the attending provider.  __________________________________________   Eppie Gibson, MD

## 2015-04-10 NOTE — Patient Instructions (Signed)

## 2015-04-10 NOTE — Therapy (Signed)
[place], [place], [address] Phone: [phone]   Fax:  [phone]  Physical Therapy Evaluation  Patient Details  Name: RAMSEY MIDGETT MRN: [id number] Date of Birth: [birth year] Referring Provider:  Jovita Kussmaul, MD  Encounter Date: 04/10/2015      PT End of Session - 04/10/15 1518    Visit Number 1   Number of Visits 1   PT Start Time 2353   PT Stop Time 1405  Also saw patient from 1420-1435   PT Time Calculation (min) 17 min   Activity Tolerance Patient tolerated treatment well   Behavior During Therapy [facility] for tasks assessed/performed      Past Medical History  Diagnosis Date  . Vitamin D deficiency   . SUI (stress urinary incontinence, female)   . Breast cancer     History reviewed. No pertinent past surgical history.  There were no vitals filed for this visit.  Visit Diagnosis:  Carcinoma of right breast upper inner quadrant - Plan: PT plan of care cert/re-cert  Abnormal posture - Plan: PT plan of care cert/re-cert      Subjective Assessment - 04/10/15 1519    Pertinent History Patient was diagnosed 04/02/15 with right upper inner quadrant ER/PR positive, HER2 negative breast cancer with a Ki67 of 5%.  It is 6 mm in size and is Grade 1 invasive ductal carcinoma.            [facility] PT Assessment - 04/10/15 0001    Assessment   Medical Diagnosis Right breast cancer   Onset Date 04/02/15   Precautions   Precautions Other (comment)  Active breast cancer   Restrictions   Weight Bearing Restrictions No   Balance Screen   Has the patient fallen in the past 6 months No   Has the patient had a decrease in activity level because of a fear of falling?  No   Is the patient reluctant to leave their home because of a fear of falling?  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   Cognition   Overall Cognitive Status Within Functional Limits for tasks assessed   Posture/Postural Control   Posture/Postural Control Postural limitations   Postural Limitations Rounded Shoulders;Forward head   ROM / Strength   AROM / PROM / Strength AROM;Strength   AROM   AROM Assessment Site Shoulder   Right/Left Shoulder Right;Left   Right Shoulder Extension 64 Degrees   Right Shoulder Flexion 149 Degrees   Right Shoulder ABduction 150 Degrees   Right Shoulder Internal Rotation 73 Degrees   Right Shoulder External Rotation 75 Degrees   Left Shoulder Extension 50 Degrees   Left Shoulder Flexion 140 Degrees   Left Shoulder ABduction 156 Degrees   Left Shoulder Internal Rotation 75 Degrees   Left Shoulder External Rotation 72 Degrees   Strength   Overall Strength Within functional limits for tasks performed           LYMPHEDEMA/ONCOLOGY QUESTIONNAIRE - 04/10/15 1516    Type   Cancer Type Right breast   Lymphedema Assessments   Lymphedema Assessments Upper extremities   Right Upper Extremity Lymphedema   10 cm Proximal to Olecranon  Process 27.9 cm   Olecranon Process 24.5 cm   10 cm Proximal to Ulnar Styloid Process 21.2 cm   Just Proximal to Ulnar Styloid Process 14.3 cm   Across Hand at [employer] 17.2 cm   At [place] of 2nd Digit 6.1 cm   Left Upper Extremity Lymphedema   10 cm Proximal to Olecranon Process 29.9 cm   Olecranon Process 23.6 cm   10 cm Proximal to Ulnar Styloid Process 21.1 cm   Just Proximal to Ulnar Styloid Process 14.6 cm   Across Hand at [employer] 17.2 cm   At [place] of 2nd Digit 6 cm      Patient was instructed today in a home exercise program today for post op shoulder range of motion. These included active assist shoulder flexion in sitting, scapular  retraction, wall walking with shoulder abduction, and hands behind head external rotation.  She was encouraged to do these twice a day, holding 3 seconds and repeating 5 times when permitted by her physician.         PT Education - 04/10/15 1517    Education provided Yes   Education Details Post op shoulder ROM HEP and lymphedema risk reduction   Person(s) Educated Patient;Child(ren)   Methods Explanation;Demonstration;Handout   Comprehension Verbalized understanding;Returned demonstration              Breast Clinic Goals - 04/10/15 1522    Patient will be able to verbalize understanding of pertinent lymphedema risk reduction practices relevant to her diagnosis specifically related to skin care.   Time 1   Period Days   Status Achieved   Patient will be able to return demonstrate and/or verbalize understanding of the post-op home exercise program related to regaining shoulder range of motion.   Time 1   Period Days   Status Achieved   Patient will be able to verbalize understanding of the importance of attending the postoperative After Breast Cancer Class for further lymphedema risk reduction education and therapeutic exercise.   Time 1   Period Days   Status Achieved              Plan - 04/10/15 1519    Clinical Impression Statement Patient was diagnosed 04/02/15 with right upper inner quadrant ER/PR positive, HER2 negative breast cancer with a Ki67 of 5%.  It is 6 mm in size and is Grade 1 invasive ductal carcinoma.  She is planning to have a right lumpectomy with a sentinel node biopsy followed by oncotype testing if mass is > 5 mm followed by radiation and anti-estrogen therapy.   Pt will benefit from skilled therapeutic intervention in order to improve on the following deficits Decreased range of motion;Pain;Impaired UE functional use;Decreased knowledge of precautions;Decreased strength;Increased edema   Rehab Potential Excellent   Clinical Impairments Affecting  Rehab Potential none   PT Frequency One time visit   PT Treatment/Interventions Patient/family education;Therapeutic exercise   Consulted and Agree with Plan of Care Patient;Family member/caregiver   Family Member Consulted daughter     Patient will follow up at outpatient cancer rehab if needed following surgery.  If the patient requires physical therapy at that time, a specific plan will be dictated and sent to the referring physician for approval. The patient was educated today on appropriate basic range of motion exercises to begin post operatively and the importance of attending the After Breast Cancer class following surgery.  Patient was educated today on lymphedema risk reduction practices as it pertains  to recommendations that will benefit the patient immediately following surgery.  She verbalized good understanding.  No additional physical therapy is indicated at this time.       Problem List Patient Active Problem List   Diagnosis Date Noted  . Breast cancer of upper-outer quadrant of right female breast 04/04/2015  . GERD (gastroesophageal reflux disease) 02/19/2014  . Laboratory examination ordered as part of a routine general medical examination 02/19/2014  . Unspecified vitamin D deficiency 02/19/2014  . Urge incontinence 02/19/2014    Annia Friendly, PT 04/10/2015, 3:24 PM  [place], [place], [address] Phone: [phone]   Fax:  [phone]

## 2015-04-10 NOTE — Progress Notes (Signed)
[place]  Telephone:(336) [phone] Fax:(336) [phone]     ID: [id number] ONEIL DOB: Oct 31, 1951  MR#: [id number]  UVJ#:[id number]  Patient Care Team: Briscoe Deutscher, MD as PCP - General (Family Medicine) Kem Boroughs, FNP as Nurse Practitioner (Nurse Practitioner) Autumn Messing III, MD as Consulting Physician (General Surgery) Chauncey Cruel, MD as Consulting Physician (Oncology) Eppie Gibson, MD as Attending Physician (Radiation Oncology) Rockwell Germany, RN as Registered Nurse Mauro Kaufmann, RN as Registered Nurse Holley Bouche, NP as Nurse Practitioner (Nurse Practitioner) PCP: Deborah Miyamoto, MD GYN: Roe Coombs, NP OTHER MD:  CHIEF COMPLAINT: Estrogen receptor positive breast cancer  CURRENT TREATMENT: Awaiting definitive surgery   BREAST CANCER HISTORY: Deborah Mercado had routine bilateral screening mammography at the breast Center for 20 04/19/2015. This showed the breast density to be category B. A possible mass in the right breast with 2 diagnostic right mammography with tomosynthesis and right breast ultrasonography 04/01/2015. There was a small spiculated mass in the central right breast at approximately 11:30 o'clock. There was no palpable abnormality by exam. Ultrasound confirmed an irregularly marginated hypoechoic mass measuring 6 mm in the area in question. Ultrasound of the right axilla was unremarkable.  Biopsy of the mass in question was obtained 04/01/2015 and showed ([id number]) and invasive ductal carcinoma, grade 1, estrogen receptor 75% positive, progesterone receptor 90% positive, both with strong staining intensity, with an MIB-1 of 5%, and no HER-2 amplification, the signals ratio being 1.79 and the number per cell 2.60.  The patient's subsequent history is as detailed below  INTERVAL HISTORY: Deborah Mercado was evaluated in the multidisciplinary breast cancer clinic 04/10/2015 accompanied by her daughter Deborah Mercado. The patient's case was also  presented that same morning at the multidisciplinary breast cancer conference. At that time a preliminary plan was suggested for breast conserving surgery, with Oncotype depending on the size of the tumor, but likely no chemotherapy. Radiation was to follow and then antiestrogen's  REVIEW OF SYSTEMS: There were no specific symptoms leading to the original mammogram, which was routinely scheduled. The patient denies unusual headaches, visual changes, nausea, vomiting, stiff neck, dizziness, or gait imbalance. There has been no cough, phlegm production, or pleurisy, no chest pain or pressure, and no change in bowel habits. She has some bladder spasticity. She has nocturia 3. The patient denies fever, rash, bleeding, unexplained fatigue or unexplained weight loss. A detailed review of systems was otherwise entirely negative.  PAST MEDICAL HISTORY: Past Medical History  Diagnosis Date  . Vitamin D deficiency   . SUI (stress urinary incontinence, female)   . Breast cancer     PAST SURGICAL HISTORY: History reviewed. No pertinent past surgical history.  FAMILY HISTORY Family History  Problem Relation Age of Onset  . Heart disease Mother   . Rheum arthritis Mother   . Hypertension Mother   . Rheum arthritis Maternal Grandmother   . Heart attack Maternal Grandmother   . Heart disease Maternal Grandmother    the patient's parents are still living, in their mid 53s. The patient had no brothers, one sister. There is no history of breast or ovarian cancer in the family to her knowledge.  GYNECOLOGIC HISTORY:  Patient's last menstrual period was 11/30/2004. Menarche age 68, first live birth age 85. The patient is GX P1. She has been on hormone replacement until this diagnosis, with her last dose dose 04/05/2015.  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 She is divorced and lives  I herself with 2 miniature schnauzers and approximately 66 birds. Her daughter Deborah Mercado lives in [place] where she is supplied Electronics engineer for a [employer].    ADVANCED DIRECTIVES: Not in place   HEALTH MAINTENANCE: History  Substance Use Topics  . Smoking status: Never Smoker   . Smokeless tobacco: Never Used  . Alcohol Use: No     Comment: occ     Colonoscopy:  PAP:  Bone density:  Lipid panel:  Allergies  Allergen Reactions  . Influenza Vaccines   . Monistat [Miconazole] Other (See Comments)    Severe burning and pain with use    Current Outpatient Prescriptions  Medication Sig Dispense Refill  . pantoprazole (PROTONIX) 20 MG tablet Take 1 tablet (20 mg total) by mouth daily. 90 tablet 3  . solifenacin (VESICARE) 5 MG tablet Take 1 tablet (5 mg total) by mouth daily. 90 tablet 3  . Vitamin D, Ergocalciferol, (DRISDOL) 50000 UNITS CAPS capsule Take 1 capsule (50,000 Units total) by mouth every 7 (seven) days. 30 capsule 3  . pramoxine (PROCTOFOAM) 1 % foam Place 1 application rectally 3 (three) times daily as needed for itching. (Patient not taking: Reported on 04/10/2015) 15 g 1   No current facility-administered medications for this visit.    OBJECTIVE: Middle-aged white woman in no acute distress Filed Vitals:   04/10/15 1251  BP: 158/77  Pulse: 71  Temp: 98.4 F (36.9 C)  Resp: 18     Body mass index is 32.48 kg/(m^2).    ECOG FS:0 - Asymptomatic  Ocular: Sclerae unicteric, pupils round and equal Ear-nose-throat: Oropharynx clear and moist Lymphatic: No cervical or supraclavicular adenopathy Lungs no rales or rhonchi, good excursion bilaterally Heart regular rate and rhythm, no murmur appreciated Abd soft, nontender, positive bowel sounds, no masses palpated MSK no focal spinal tenderness, no joint edema Neuro: non-focal, well-oriented, appropriate affect Breasts: The right breast is status post recent biopsy. There is a moderate ecchymosis at the biopsy site. I do not palpate him as clearly distinct from a possible small hematoma in  that area. There are no skin or nipple changes of concern. The right axilla is benign per the left breast is unremarkable.   LAB RESULTS:  CMP     Component Value Date/Time   NA 143 04/10/2015 1225   NA 142 03/18/2015 1454   K 4.2 04/10/2015 1225   K 3.9 03/18/2015 1454   CL 108 03/18/2015 1454   CO2 25 04/10/2015 1225   CO2 24 03/18/2015 1454   GLUCOSE 91 04/10/2015 1225   GLUCOSE 87 03/18/2015 1454   BUN 8.0 04/10/2015 1225   BUN 8 03/18/2015 1454   CREATININE 0.8 04/10/2015 1225   CREATININE 0.76 03/18/2015 1454   CALCIUM 9.0 04/10/2015 1225   CALCIUM 8.7 03/18/2015 1454   PROT 7.4 04/10/2015 1225   PROT 6.8 03/18/2015 1454   ALBUMIN 3.9 04/10/2015 1225   ALBUMIN 4.0 03/18/2015 1454   AST 20 04/10/2015 1225   AST 15 03/18/2015 1454   ALT 11 04/10/2015 1225   ALT 10 03/18/2015 1454   ALKPHOS 84 04/10/2015 1225   ALKPHOS 68 03/18/2015 1454   BILITOT 0.69 04/10/2015 1225   BILITOT 0.8 03/18/2015 1454    INo results found for: SPEP, UPEP  Lab Results  Component Value Date   WBC 6.8 04/10/2015   NEUTROABS 4.3 04/10/2015   HGB 13.9 04/10/2015   HCT 41.8 04/10/2015   MCV 86.2 04/10/2015   PLT 200 04/10/2015  Chemistry      Component Value Date/Time   NA 143 04/10/2015 1225   NA 142 03/18/2015 1454   K 4.2 04/10/2015 1225   K 3.9 03/18/2015 1454   CL 108 03/18/2015 1454   CO2 25 04/10/2015 1225   CO2 24 03/18/2015 1454   BUN 8.0 04/10/2015 1225   BUN 8 03/18/2015 1454   CREATININE 0.8 04/10/2015 1225   CREATININE 0.76 03/18/2015 1454      Component Value Date/Time   CALCIUM 9.0 04/10/2015 1225   CALCIUM 8.7 03/18/2015 1454   ALKPHOS 84 04/10/2015 1225   ALKPHOS 68 03/18/2015 1454   AST 20 04/10/2015 1225   AST 15 03/18/2015 1454   ALT 11 04/10/2015 1225   ALT 10 03/18/2015 1454   BILITOT 0.69 04/10/2015 1225   BILITOT 0.8 03/18/2015 1454       No results found for: LABCA2  No components found for: LABCA125  No results for input(s): INR  in the last 168 hours.  Urinalysis    Component Value Date/Time   BILIRUBINUR neg 03/18/2015 1410   PROTEINUR neg 03/18/2015 1410   UROBILINOGEN negative 03/18/2015 1410   NITRITE neg 03/18/2015 1410   LEUKOCYTESUR Negative 03/18/2015 1410    STUDIES: Mm Digital Screening Bilateral  03/25/2015   CLINICAL DATA:  Screening.  EXAM: DIGITAL SCREENING BILATERAL MAMMOGRAM WITH CAD  COMPARISON:  Previous exam(s).  ACR Breast Density Category b: There are scattered areas of fibroglandular density.  FINDINGS: In the right breast, a possible mass warrants further evaluation. In the left breast, no findings suspicious for malignancy. Images were processed with CAD.  IMPRESSION: Further evaluation is suggested for possible mass in the right breast.  RECOMMENDATION: Diagnostic mammogram and possibly ultrasound of the right breast. (Code:[id number])  The patient will be contacted regarding the findings, and additional imaging will be scheduled.  BI-RADS CATEGORY  0: Incomplete. Need additional imaging evaluation and/or prior mammograms for comparison.   Electronically Signed   By: Conchita Paris M.D.   On: 03/25/2015 14:21   US Breast Ltd Uni Right Inc Axilla  04/01/2015   CLINICAL DATA:  RECALL FROM SCREENING MAMMOGRAM.  EXAM: DIGITAL DIAGNOSTIC RIGHT MAMMOGRAM WITH 3D TOMOSYNTHESIS WITH CAD  ULTRASOUND RIGHT BREAST  COMPARISON:  Previous examinations the most recent which is dated 03/25/2015.  ACR Breast Density Category b: There are scattered areas of fibroglandular density.  FINDINGS: Additional views of the right breast demonstrate a small spiculated mass to be present within the central right breast at approximately the 11:30 o'clock position. This measures 7 mm in diameter.  Mammographic images were processed with CAD.  On physical exam, there is no discrete palpable abnormality.  Targeted ultrasound is performed, showing an irregularly marginated hypoechoic mass located within the right breast at the 11:30  o'clock position 1 cm from the nipple posteriorly located within the breast. This measures 6 x 6 x 5 mm in size. This is suspicious for a small invasive mammary carcinoma.  Ultrasound of the right axilla demonstrates normal axillary contents and no evidence for adenopathy.  As this is better visualized mammographically, I have discussed stereotactic biopsy of this mass. The patient would like to proceed with this. This will be performed and reported separately.  IMPRESSION: 6 mm suspicious mass located within the right breast at the 11:30 o'clock position 1 cm from the nipple. Tissue sampling is recommended. Stereotactic core biopsy will be performed and reported separately.  RECOMMENDATION: Right breast stereotactic core biopsy.  I have discussed  the findings and recommendations with the patient. Results were also provided in writing at the conclusion of the visit. If applicable, a reminder letter will be sent to the patient regarding the next appointment.  BI-RADS CATEGORY  5: Highly suggestive of malignancy.   Electronically Signed   By: Altamese Cabal M.D.   On: 04/01/2015 13:51   Mm Diag Breast Tomo Uni Right  04/01/2015   CLINICAL DATA:  Post right breast stereotactic biopsy.  EXAM: DIAGNOSTIC RIGHT MAMMOGRAM POST STEREOTACTIC BIOPSY  COMPARISON:  Previous exam(s).  FINDINGS: Mammographic images were obtained following stereotactically guided biopsy of the small spiculated mass located within the central right breast. The X shaped clip is in appropriate position.  IMPRESSION: Appropriate position of clip following right breast stereotactic biopsy.  Final Assessment: Post Procedure Mammograms for Marker Placement   Electronically Signed   By: Altamese Cabal M.D.   On: 04/01/2015 14:51   Mm Diag Breast Tomo Uni Right  04/01/2015   CLINICAL DATA:  RECALL FROM SCREENING MAMMOGRAM.  EXAM: DIGITAL DIAGNOSTIC RIGHT MAMMOGRAM WITH 3D TOMOSYNTHESIS WITH CAD  ULTRASOUND RIGHT BREAST  COMPARISON:  Previous  examinations the most recent which is dated 03/25/2015.  ACR Breast Density Category b: There are scattered areas of fibroglandular density.  FINDINGS: Additional views of the right breast demonstrate a small spiculated mass to be present within the central right breast at approximately the 11:30 o'clock position. This measures 7 mm in diameter.  Mammographic images were processed with CAD.  On physical exam, there is no discrete palpable abnormality.  Targeted ultrasound is performed, showing an irregularly marginated hypoechoic mass located within the right breast at the 11:30 o'clock position 1 cm from the nipple posteriorly located within the breast. This measures 6 x 6 x 5 mm in size. This is suspicious for a small invasive mammary carcinoma.  Ultrasound of the right axilla demonstrates normal axillary contents and no evidence for adenopathy.  As this is better visualized mammographically, I have discussed stereotactic biopsy of this mass. The patient would like to proceed with this. This will be performed and reported separately.  IMPRESSION: 6 mm suspicious mass located within the right breast at the 11:30 o'clock position 1 cm from the nipple. Tissue sampling is recommended. Stereotactic core biopsy will be performed and reported separately.  RECOMMENDATION: Right breast stereotactic core biopsy.  I have discussed the findings and recommendations with the patient. Results were also provided in writing at the conclusion of the visit. If applicable, a reminder letter will be sent to the patient regarding the next appointment.  BI-RADS CATEGORY  5: Highly suggestive of malignancy.   Electronically Signed   By: Altamese Cabal M.D.   On: 04/01/2015 13:51   Mm Rt Breast Bx W Loc Dev 1st Lesion Image Bx Spec Stereo Guide  04/03/2015   ADDENDUM REPORT: 04/03/2015 08:34  ADDENDUM: Pathology revealed grade I invasive ductal carcinoma in the right breast. This was found to be concordant by Dr. Luberta Robertson.  Pathology was discussed with the patient by telephone. She reported doing well after the biopsy with tenderness at the site. Post biopsy instructions and care were reviewed and her questions were answered. She has been scheduled at [facility] on Apr 10, 2015. The patient is encouraged to come to [facility] for educational materials. My number was provided for future questions and concerns.  Pathology results reported by Susa Raring RN, BSN on Apr 03, 2015.  Electronically Signed   By: Altamese Cabal M.D.   On: 04/03/2015 08:34   04/03/2015   CLINICAL DATA:  Right breast mass.  EXAM: RIGHT BREAST STEREOTACTIC CORE NEEDLE BIOPSY  COMPARISON:  Previous exams.  FINDINGS: The patient and I discussed the procedure of stereotactic-guided biopsy including benefits and alternatives. We discussed the high likelihood of a successful procedure. We discussed the risks of the procedure including infection, bleeding, tissue injury, clip migration, and inadequate sampling. Informed written consent was given. The usual time out protocol was performed immediately prior to the procedure.  Using sterile technique and 2% lidocaine as local anesthetic, under stereotactic guidance, a 9 gauge vacuum assisted device was used to perform core needle biopsy of the mass located within the central right breast at the 11:30 o'clock subareolar position using a superior craniocaudal approach.  At the conclusion of the procedure, an X shaped tissue marker clip was deployed into the biopsy cavity. Follow-up 2-view mammogram was performed and dictated separately.  IMPRESSION: Stereotactic-guided biopsy of the small spiculated or mass located within the subareolar right breast at the 11:30 o'clock position as discussed above. No apparent complications.  Electronically Signed: By: Altamese Cabal M.D. On: 04/01/2015 14:50    ASSESSMENT: 64 y.o. [place], [place] woman status post  right breast biopsy 04/01/2015 for a clinicalT1b N0, stage IA invasive ductal carcinoma, grade 1, estrogen and progesterone receptor positive, HER-2 not amplified, with an MIB-1 of 5%  (1) rest conserving surgery is pending  (2) Oncotype will be sent depending on the size of the primary tumor and assuming lymph nodes are pathologically negative  (3) radiation therapy will complete local therapy  (4) antiestrogen therapy to follow radiation  PLAN: We spent the better part of today's 50 minute appointment discussing the biology of breast cancer in general, and the specifics of the patient's tumor in particular. [name] understands that she appears to have a very early, nonaggressive-looking, slow-growing invasive ductal carcinoma.   For all those reasons I think she is unlikely to benefit enough from chemotherapy to warrant it. If the tumor is as small as advertised, I would probably not send an Oncotype. If he does turn out to be larger than expected, [place] it remains node-negative, we would request an Oncotype and today I gave her written information regarding that test.  She understands the difference between local and systemic therapy. She knows that she will need surgery. After surgery, which will be lumpectomy, she will need radiation. I am planning to see the patient in approximately 4 weeks. By that time we should have the definitive surgical results. However we would not start anti-estrogens until she completes radiation treatments.  The patient has a good understanding of the overall plan. She agrees with it. She knows the goal of treatment in her case is cure. She will call with any problems that may develop before her next visit here.  Chauncey Cruel, MD   04/10/2015 3:51 PM Medical Oncology and Hematology [facility] [address] [place], [place] [address] Tel. [phone]    Fax. [phone]

## 2015-04-11 ENCOUNTER — Telehealth: Payer: Self-pay | Admitting: Oncology

## 2015-04-11 NOTE — Telephone Encounter (Signed)
Left message to confirm appointment for June. Mailed calendar. °

## 2015-04-12 ENCOUNTER — Other Ambulatory Visit: Payer: Self-pay | Admitting: General Surgery

## 2015-04-12 ENCOUNTER — Encounter: Payer: Self-pay | Admitting: General Practice

## 2015-04-12 DIAGNOSIS — C50211 Malignant neoplasm of upper-inner quadrant of right female breast: Secondary | ICD-10-CM

## 2015-04-12 NOTE — Progress Notes (Signed)
[place] Psychosocial Distress Screening Spiritual Care  Met [place] at [facility] to introduce [place] team/resources, reviewing distress screen per protocol.  The patient scored a 7 on the Psychosocial Distress Thermometer which indicates severe distress. Also assessed for distress and other psychosocial needs.   ONCBCN DISTRESS SCREENING 04/12/2015  Screening Type Initial Screening  Distress experienced in past week (1-10) 7  Emotional problem type Nervousness/Anxiety  Information Concerns Type Lack of info about diagnosis;Lack of info about treatment  Referral to support programs Yes  Other [place] works at [employer] for [employer], and she recognized me from my calls.  She describes herself as a very private person, electing against an [facility] referral at this time.  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  Follow up needed: No.  Pt has packet of [place], and is aware of ongoing chaplain availability for support.  Please also page as needs arise.  Thank you.  [place], [place]

## 2015-04-15 ENCOUNTER — Telehealth: Payer: Self-pay | Admitting: *Deleted

## 2015-04-15 NOTE — Telephone Encounter (Signed)
Spoke to pt concerning [place] from 04/10/15. Denies questions or concerns regarding dx or treatment care plan. Confirmed surgery date and f/u appt with Dr. Jana Hakim. Encourage pt to call with needs. Received verbal understanding. Contact information given.

## 2015-04-17 ENCOUNTER — Encounter (HOSPITAL_BASED_OUTPATIENT_CLINIC_OR_DEPARTMENT_OTHER): Payer: Self-pay | Admitting: *Deleted

## 2015-04-18 ENCOUNTER — Ambulatory Visit
Admission: RE | Admit: 2015-04-18 | Discharge: 2015-04-18 | Disposition: A | Discharge status: Home / Self Care | Payer: 59 | Source: Ambulatory Visit | Attending: General Surgery | Admitting: General Surgery

## 2015-04-18 DIAGNOSIS — C50211 Malignant neoplasm of upper-inner quadrant of right female breast: Secondary | ICD-10-CM

## 2015-04-19 ENCOUNTER — Ambulatory Visit (HOSPITAL_BASED_OUTPATIENT_CLINIC_OR_DEPARTMENT_OTHER): Payer: 59 | Admitting: Certified Registered"

## 2015-04-19 ENCOUNTER — Encounter (HOSPITAL_BASED_OUTPATIENT_CLINIC_OR_DEPARTMENT_OTHER)
Admission: RE | Disposition: A | Discharge status: Home / Self Care | Payer: Self-pay | Source: Ambulatory Visit | Attending: General Surgery

## 2015-04-19 ENCOUNTER — Ambulatory Visit (HOSPITAL_BASED_OUTPATIENT_CLINIC_OR_DEPARTMENT_OTHER)
Admission: RE | Admit: 2015-04-19 | Discharge: 2015-04-19 | Disposition: A | Discharge status: Home / Self Care | Payer: 59 | Source: Ambulatory Visit | Attending: General Surgery | Admitting: General Surgery

## 2015-04-19 ENCOUNTER — Ambulatory Visit
Admission: RE | Admit: 2015-04-19 | Discharge: 2015-04-19 | Disposition: A | Discharge status: Home / Self Care | Payer: 59 | Source: Ambulatory Visit | Attending: General Surgery | Admitting: General Surgery

## 2015-04-19 ENCOUNTER — Encounter (HOSPITAL_COMMUNITY)
Admission: RE | Admit: 2015-04-19 | Discharge: 2015-04-19 | Disposition: A | Discharge status: Home / Self Care | Payer: 59 | Source: Ambulatory Visit | Attending: General Surgery | Admitting: General Surgery

## 2015-04-19 ENCOUNTER — Encounter (HOSPITAL_BASED_OUTPATIENT_CLINIC_OR_DEPARTMENT_OTHER): Payer: Self-pay | Admitting: Certified Registered"

## 2015-04-19 ENCOUNTER — Other Ambulatory Visit: Payer: Self-pay | Admitting: General Surgery

## 2015-04-19 DIAGNOSIS — C50211 Malignant neoplasm of upper-inner quadrant of right female breast: Secondary | ICD-10-CM | POA: Diagnosis not present

## 2015-04-19 DIAGNOSIS — K219 Gastro-esophageal reflux disease without esophagitis: Secondary | ICD-10-CM | POA: Insufficient documentation

## 2015-04-19 DIAGNOSIS — C50911 Malignant neoplasm of unspecified site of right female breast: Secondary | ICD-10-CM | POA: Diagnosis present

## 2015-04-19 HISTORY — PX: BREAST LUMPECTOMY: SHX2

## 2015-04-19 HISTORY — PX: BREAST LUMPECTOMY WITH RADIOACTIVE SEED AND SENTINEL LYMPH NODE BIOPSY: SHX6550

## 2015-04-19 HISTORY — DX: Gastro-esophageal reflux disease without esophagitis: K21.9

## 2015-04-19 SURGERY — BREAST LUMPECTOMY WITH RADIOACTIVE SEED AND SENTINEL LYMPH NODE BIOPSY
Anesthesia: General | Site: Breast | Laterality: Right

## 2015-04-19 MED ORDER — CHLORHEXIDINE GLUCONATE 4 % EX LIQD: 1.0000 "application " | Freq: Once | CUTANEOUS | Status: DC

## 2015-04-19 MED ORDER — PROPOFOL 10 MG/ML IV BOLUS
INTRAVENOUS | Status: DC | PRN
  Administered 2015-04-19: 150 mg via INTRAVENOUS

## 2015-04-19 MED ORDER — FENTANYL CITRATE (PF) 100 MCG/2ML IJ SOLN
INTRAMUSCULAR | Status: DC | PRN
  Administered 2015-04-19: 50 ug via INTRAVENOUS
  Administered 2015-04-19: 25 ug via INTRAVENOUS

## 2015-04-19 MED ORDER — BUPIVACAINE HCL (PF) 0.25 % IJ SOLN
INTRAMUSCULAR | Status: DC | PRN
  Administered 2015-04-19: 30 mL

## 2015-04-19 MED ORDER — SODIUM CHLORIDE 0.9 % IJ SOLN
INTRAMUSCULAR | Status: AC
  Filled 2015-04-19: qty 10

## 2015-04-19 MED ORDER — OXYCODONE-ACETAMINOPHEN 5-325 MG PO TABS: 1.0000 | ORAL_TABLET | ORAL | Status: DC | PRN

## 2015-04-19 MED ORDER — ONDANSETRON HCL 4 MG/2ML IJ SOLN
INTRAMUSCULAR | Status: AC
  Filled 2015-04-19: qty 2

## 2015-04-19 MED ORDER — CEFAZOLIN SODIUM-DEXTROSE 2-3 GM-% IV SOLR
INTRAVENOUS | Status: AC
  Filled 2015-04-19: qty 50

## 2015-04-19 MED ORDER — BUPIVACAINE HCL (PF) 0.25 % IJ SOLN
INTRAMUSCULAR | Status: AC
  Filled 2015-04-19: qty 30

## 2015-04-19 MED ORDER — DEXAMETHASONE SODIUM PHOSPHATE 4 MG/ML IJ SOLN
INTRAMUSCULAR | Status: DC | PRN
  Administered 2015-04-19: 10 mg via INTRAVENOUS

## 2015-04-19 MED ORDER — LACTATED RINGERS IV SOLN
INTRAVENOUS | Status: DC
  Administered 2015-04-19 (×2): via INTRAVENOUS
  Administered 2015-04-19: 10 mL/h via INTRAVENOUS

## 2015-04-19 MED ORDER — FENTANYL CITRATE (PF) 100 MCG/2ML IJ SOLN
INTRAMUSCULAR | Status: AC
  Filled 2015-04-19: qty 2

## 2015-04-19 MED ORDER — FENTANYL CITRATE (PF) 100 MCG/2ML IJ SOLN
INTRAMUSCULAR | Status: AC
  Filled 2015-04-19: qty 6

## 2015-04-19 MED ORDER — MIDAZOLAM HCL 2 MG/2ML IJ SOLN
1.0000 mg | INTRAMUSCULAR | Status: DC | PRN
  Administered 2015-04-19: 2 mg via INTRAVENOUS

## 2015-04-19 MED ORDER — TECHNETIUM TC 99M SULFUR COLLOID FILTERED
1.0000 | Freq: Once | INTRAVENOUS | Status: AC | PRN
  Administered 2015-04-19: 1 via INTRADERMAL

## 2015-04-19 MED ORDER — HYDROMORPHONE HCL 1 MG/ML IJ SOLN: 0.2500 mg | INTRAMUSCULAR | Status: DC | PRN

## 2015-04-19 MED ORDER — LIDOCAINE HCL (CARDIAC) 20 MG/ML IV SOLN
INTRAVENOUS | Status: DC | PRN
  Administered 2015-04-19: 30 mg via INTRAVENOUS

## 2015-04-19 MED ORDER — ONDANSETRON HCL 4 MG/2ML IJ SOLN
4.0000 mg | Freq: Once | INTRAMUSCULAR | Status: AC | PRN
  Administered 2015-04-19: 4 mg via INTRAVENOUS

## 2015-04-19 MED ORDER — FENTANYL CITRATE (PF) 100 MCG/2ML IJ SOLN
50.0000 ug | INTRAMUSCULAR | Status: DC | PRN
  Administered 2015-04-19: 50 ug via INTRAVENOUS
  Administered 2015-04-19: 25 ug via INTRAVENOUS

## 2015-04-19 MED ORDER — ONDANSETRON HCL 4 MG/2ML IJ SOLN
INTRAMUSCULAR | Status: DC | PRN
  Administered 2015-04-19: 4 mg via INTRAVENOUS

## 2015-04-19 MED ORDER — MIDAZOLAM HCL 2 MG/2ML IJ SOLN
INTRAMUSCULAR | Status: AC
  Filled 2015-04-19: qty 2

## 2015-04-19 MED ORDER — METHYLENE BLUE 1 % INJ SOLN
INTRAMUSCULAR | Status: AC
  Filled 2015-04-19: qty 10

## 2015-04-19 MED ORDER — BUPIVACAINE-EPINEPHRINE (PF) 0.5% -1:200000 IJ SOLN
INTRAMUSCULAR | Status: DC | PRN
  Administered 2015-04-19: 30 mL

## 2015-04-19 MED ORDER — MEPERIDINE HCL 25 MG/ML IJ SOLN: 6.2500 mg | INTRAMUSCULAR | Status: DC | PRN

## 2015-04-19 MED ORDER — 0.9 % SODIUM CHLORIDE (POUR BTL) OPTIME
TOPICAL | Status: DC | PRN
  Administered 2015-04-19: 300 mL

## 2015-04-19 MED ORDER — CEFAZOLIN SODIUM-DEXTROSE 2-3 GM-% IV SOLR
2.0000 g | INTRAVENOUS | Status: AC
  Administered 2015-04-19: 2 g via INTRAVENOUS

## 2015-04-19 SURGICAL SUPPLY — 46 items
APPLIER CLIP 9.375 MED OPEN (MISCELLANEOUS) ×2
APR CLP MED 9.3 20 MLT OPN (MISCELLANEOUS) ×1
BLADE SURG 15 STRL LF DISP TIS (BLADE) ×1 IMPLANT
BLADE SURG 15 STRL SS (BLADE) ×2
CANISTER SUC SOCK COL 7IN (MISCELLANEOUS) IMPLANT
CANISTER SUCT 1200ML W/VALVE (MISCELLANEOUS) IMPLANT
CHLORAPREP W/TINT 26ML (MISCELLANEOUS) ×2 IMPLANT
CLIP APPLIE 9.375 MED OPEN (MISCELLANEOUS) ×1 IMPLANT
COVER BACK TABLE 60X90IN (DRAPES) ×2 IMPLANT
COVER MAYO STAND STRL (DRAPES) ×2 IMPLANT
COVER PROBE W GEL 5X96 (DRAPES) ×2 IMPLANT
DECANTER SPIKE VIAL GLASS SM (MISCELLANEOUS) IMPLANT
DEVICE DUBIN W/COMP PLATE 8390 (MISCELLANEOUS) ×2 IMPLANT
DRAPE LAPAROSCOPIC ABDOMINAL (DRAPES) ×2 IMPLANT
DRAPE UTILITY XL STRL (DRAPES) ×2 IMPLANT
ELECT COATED BLADE 2.86 ST (ELECTRODE) ×2 IMPLANT
ELECT REM PT RETURN 9FT ADLT (ELECTROSURGICAL) ×2
ELECTRODE REM PT RTRN 9FT ADLT (ELECTROSURGICAL) ×1 IMPLANT
GLOVE BIO SURGEON STRL SZ7 (GLOVE) ×2 IMPLANT
GLOVE BIO SURGEON STRL SZ7.5 (GLOVE) ×2 IMPLANT
GLOVE BIOGEL PI IND STRL 7.5 (GLOVE) IMPLANT
GLOVE BIOGEL PI INDICATOR 7.5 (GLOVE) ×2
GLOVE EXAM NITRILE MD LF STRL (GLOVE) ×1 IMPLANT
GOWN STRL REUS W/ TWL LRG LVL3 (GOWN DISPOSABLE) ×2 IMPLANT
GOWN STRL REUS W/ TWL XL LVL3 (GOWN DISPOSABLE) IMPLANT
GOWN STRL REUS W/TWL LRG LVL3 (GOWN DISPOSABLE) ×4
GOWN STRL REUS W/TWL XL LVL3 (GOWN DISPOSABLE) ×2
KIT MARKER MARGIN INK (KITS) ×2 IMPLANT
LIQUID BAND (GAUZE/BANDAGES/DRESSINGS) ×2 IMPLANT
NDL HYPO 25X1 1.5 SAFETY (NEEDLE) ×1 IMPLANT
NDL SAFETY ECLIPSE 18X1.5 (NEEDLE) IMPLANT
NEEDLE HYPO 18GX1.5 SHARP (NEEDLE)
NEEDLE HYPO 25X1 1.5 SAFETY (NEEDLE) ×2 IMPLANT
NS IRRIG 1000ML POUR BTL (IV SOLUTION) ×1 IMPLANT
PACK BASIN DAY SURGERY FS (CUSTOM PROCEDURE TRAY) ×2 IMPLANT
PENCIL BUTTON HOLSTER BLD 10FT (ELECTRODE) ×2 IMPLANT
SLEEVE SCD COMPRESS KNEE MED (MISCELLANEOUS) ×2 IMPLANT
SPONGE LAP 18X18 X RAY DECT (DISPOSABLE) ×2 IMPLANT
SUT MON AB 4-0 PC3 18 (SUTURE) ×4 IMPLANT
SUT SILK 2 0 SH (SUTURE) IMPLANT
SUT VICRYL 3-0 CR8 SH (SUTURE) ×2 IMPLANT
SYR CONTROL 10ML LL (SYRINGE) ×2 IMPLANT
TOWEL OR 17X24 6PK STRL BLUE (TOWEL DISPOSABLE) ×2 IMPLANT
TOWEL OR NON WOVEN STRL DISP B (DISPOSABLE) ×2 IMPLANT
TUBE CONNECTING 20X1/4 (TUBING) IMPLANT
YANKAUER SUCT BULB TIP NO VENT (SUCTIONS) IMPLANT

## 2015-04-19 NOTE — Interval H&P Note (Signed)
History and Physical Interval Note:  04/19/2015 3:31 PM  [name]  has presented today for surgery, with the diagnosis of Right Breast Cancer  The various methods of treatment have been discussed with the patient and family. After consideration of risks, benefits and other options for treatment, the patient has consented to  Procedure(s): BREAST LUMPECTOMY WITH RADIOACTIVE SEED AND SENTINEL LYMPH NODE MAPPING (Right) as a surgical intervention .  The patient's history has been reviewed, patient examined, no change in status, stable for surgery.  I have reviewed the patient's chart and labs.  Questions were answered to the patient's satisfaction.     TOTH III,PAUL S

## 2015-04-19 NOTE — Anesthesia Postprocedure Evaluation (Signed)
Anesthesia Post Note  Patient: Deborah Mercado  Procedure(s) Performed: Procedure(s) (LRB): BREAST LUMPECTOMY WITH RADIOACTIVE SEED AND SENTINEL LYMPH NODE MAPPING (Right)  Anesthesia type: general  Patient location: PACU  Post pain: Pain level controlled  Post assessment: Patient's Cardiovascular Status Stable  Last Vitals:  Filed Vitals:   04/19/15 1800  BP: 125/80  Pulse: 72  Temp: 36.5 C  Resp: 11    Post vital signs: Reviewed and stable  Level of consciousness: sedated  Complications: No apparent anesthesia complications

## 2015-04-19 NOTE — Anesthesia Procedure Notes (Addendum)
Anesthesia Regional Block:  Pectoralis block  Pre-Anesthetic Checklist: ,, timeout performed, Correct Patient, Correct Site, Correct Laterality, Correct Procedure, Correct Position, site marked, Risks and benefits discussed,  Surgical consent,  Pre-op evaluation,  At surgeon's request and post-op pain management  Laterality: Right  Prep: chloraprep       Needles:  Injection technique: Single-shot     Needle Length: 9cm 9 cm Needle Gauge: 21 and 21 G    Additional Needles:  Procedures: ultrasound guided (picture in chart) Pectoralis block Narrative:  Start time: 04/19/2015 2:05 PM End time: 04/19/2015 2:15 PM Injection made incrementally with aspirations every 5 mL.  Performed by: Personally  Anesthesiologist: Lillia Abed  Additional Notes: Monitors applied. Patient sedated. Sterile prep and drape,hand hygiene and sterile gloves were used. Relevant anatomy identified.Needle position confirmed.Local anesthetic injected incrementally after negative aspiration. Local anesthetic spread visualized. Vascular puncture avoided. No complications. Image printed for medical record.The patient tolerated the procedure well.       Procedure Name: LMA Insertion Date/Time: 04/19/2015 3:46 PM Performed by: Talah Cookston D Pre-anesthesia Checklist: Patient identified, Emergency Drugs available, Suction available and Patient being monitored Patient Re-evaluated:Patient Re-evaluated prior to inductionOxygen Delivery Method: Circle System Utilized Preoxygenation: Pre-oxygenation with 100% oxygen Intubation Type: IV induction Ventilation: Mask ventilation without difficulty LMA: LMA inserted LMA Size: 4.0 Number of attempts: 1 Airway Equipment and Method: Bite block Placement Confirmation: positive ETCO2 Tube secured with: Tape Dental Injury: Teeth and Oropharynx as per pre-operative assessment

## 2015-04-19 NOTE — H&P (Signed)
Deborah Mercado 04/10/2015 10:15 AM Location: [facility] Patient #: [id number] DOB: 1951/04/06 Undefined / Language: Suszanne Mercado / Race: Undefined Female  History of Present Illness [name] [name] MD; 04/10/2015 4:27 PM) The patient is a 64 year old female who presents with breast cancer. We are asked to see the patient in consultation by Dr. Luberta Robertson to evaluate her for a right-sided breast cancer. The patient is a 64 year old white female who recently went for a routine screening mammogram. At that time she was found to have an abnormality in the upper inner quadrant of the right breast. This measured 6 mm by ultrasound. It was biopsied and came back as an invasive ductal breast cancer. It was ER and PR positive and HER-2 negative with a Ki-67 of 5%. She stopped taking hormone replacement about 5 days ago. She denies any pain or discharge from the nipple.   Other Problems Deborah Mercado [place], [place]; 04/10/2015 10:15 AM) Bladder Problems Breast Cancer Gastroesophageal Reflux Disease Hemorrhoids Lump In Breast  Past Surgical History Deborah Mercado, [place]; 04/10/2015 10:15 AM) Breast Biopsy Right. Oral Surgery  Diagnostic Studies History Deborah Mercado Deborah Merc[place], [place]; 04/10/2015 10:15 AM) Colonoscopy never Mammogram within last year Pap Smear 1-5 years ago  Social History Deborah Mercado [name], [place]; 04/10/2015 10:15 AM) Alcohol use Occasional alcohol use. Caffeine use Carbonated beverages, Tea. Tobacco use Never smoker.  Family History Deborah Mercado [place], [place]; 04/10/2015 10:15 AM) Arthritis Mother. Heart Disease Mother. Hypertension Mother. Migraine Headache Sister. Seizure disorder Sister. Thyroid problems Sister.  Pregnancy / Birth History Deborah Mercado Deborah [place], [place]; 04/10/2015 10:15 AM) Age at menarche 58 years. Age of menopause 56-50 Contraceptive History Oral contraceptives. Gravida 1 Irregular periods Maternal age 48-25 Para 1  Review of Systems Deborah Mercado  Deborah Mercado; 04/10/2015 10:15 AM) General Present- Night Sweats. Not Present- Appetite Loss, Chills, Fatigue, Fever, Weight Gain and Weight Loss. Skin Not Present- Change in Wart/Mole, Dryness, Hives, Jaundice, New Lesions, Non-Healing Wounds, Rash and Ulcer. HEENT Present- Wears glasses/contact lenses. Not Present- Earache, Hearing Loss, Hoarseness, Nose Bleed, Oral Ulcers, Ringing in the Ears, Seasonal Allergies, Sinus Pain, Sore Throat, Visual Disturbances and Yellow Eyes. Respiratory Not Present- Bloody sputum, Chronic Cough, Difficulty Breathing, Snoring and Wheezing. Breast Not Present- Breast Mass, Breast Pain, Nipple Discharge and Skin Changes. Cardiovascular Not Present- Chest Pain, Difficulty Breathing Lying Down, Leg Cramps, Palpitations, Rapid Heart Rate, Shortness of Breath and Swelling of Extremities. Gastrointestinal Present- Indigestion. Not Present- Abdominal Pain, Bloating, Bloody Stool, Change in Bowel Habits, Chronic diarrhea, Constipation, Difficulty Swallowing, Excessive gas, Gets full quickly at meals, Hemorrhoids, Nausea, Rectal Pain and Vomiting. Female Genitourinary Present- Frequency, Nocturia and Urgency. Not Present- Painful Urination and Pelvic Pain. Neurological Not Present- Decreased Memory, Fainting, Headaches, Numbness, Seizures, Tingling, Tremor, Trouble walking and Weakness. Psychiatric Present- Anxiety and Fearful. Not Present- Bipolar, Change in Sleep Pattern, Depression and Frequent crying. Endocrine Not Present- Cold Intolerance, Excessive Hunger, Hair Changes, Heat Intolerance, Hot flashes and New Diabetes. Hematology Not Present- Easy Bruising, Excessive bleeding, Gland problems, HIV and Persistent Infections.   Physical Exam [name] [name] MD; 04/10/2015 4:27 PM) General Mental Status-Alert. General Appearance-Consistent with stated age. Hydration-Well hydrated. Voice-Normal.  Head and Neck Head-normocephalic, atraumatic with no lesions or  palpable masses. Trachea-midline. Thyroid Gland Characteristics - normal size and consistency.  Eye Eyeball - Bilateral-Extraocular movements intact. Sclera/Conjunctiva - Bilateral-No scleral icterus.  Chest and Lung Exam Chest and lung exam reveals -quiet, even and easy respiratory effort with no use of accessory muscles and on auscultation, normal breath sounds, no  adventitious sounds and normal vocal resonance. Inspection Chest Wall - Normal. Back - normal.  Breast Note: There is no palpable mass in either breast. There is no palpable axillary, supraclavicular, or cervical lymphadenopathy.   Cardiovascular Cardiovascular examination reveals -normal heart sounds, regular rate and rhythm with no murmurs and normal pedal pulses bilaterally.  Abdomen Inspection Inspection of the abdomen reveals - No Hernias. Skin - Scar - no surgical scars. Palpation/Percussion Palpation and Percussion of the abdomen reveal - Soft, Non Tender, No Rebound tenderness, No Rigidity (guarding) and No hepatosplenomegaly. Auscultation Auscultation of the abdomen reveals - Bowel sounds normal.  Neurologic Neurologic evaluation reveals -alert and oriented x 3 with no impairment of recent or remote memory. Mental Status-Normal.  Musculoskeletal Normal Exam - Left-Upper Extremity Strength Normal and Lower Extremity Strength Normal. Normal Exam - Right-Upper Extremity Strength Normal and Lower Extremity Strength Normal.  Lymphatic Head & Neck  General Head & Neck Lymphatics: Bilateral - Description - Normal. Axillary  General Axillary Region: Bilateral - Description - Normal. Tenderness - Non Tender. Femoral & Inguinal  Generalized Femoral & Inguinal Lymphatics: Bilateral - Description - Normal. Tenderness - Non Tender.    Assessment & Plan [name] [name] MD; 04/10/2015 4:29 PM) PRIMARY CANCER OF UPPER INNER QUADRANT OF RIGHT FEMALE BREAST (174.2  C50.211) Impression: The  patient appears to have a small stage I cancer in the upper inner right breast. I have talked to her in detail about the different options for treatment and at this point she favors breast conservation. I think this is a very reasonable way to treat her cancer. Since her lymph nodes are clinically negative she is also a good candidate for sentinel node mapping. I have discussed with her in detail the risks and benefits of the operation to do this as well as some of the technical aspects and she understands and wishes to proceed. I will plan for a right breast radioactive seed localized lumpectomy and sentinel mode mapping     Signed by Luella Cook, MD (04/10/2015 4:29 PM)

## 2015-04-19 NOTE — Anesthesia Preprocedure Evaluation (Signed)
Anesthesia Evaluation  Patient identified by MRN, date of birth, ID band Patient awake    Reviewed: Allergy & Precautions, NPO status , Patient's Chart, lab work & pertinent test results  Airway Mallampati: I  TM Distance: >3 FB Neck ROM: Full    Dental   Pulmonary    Pulmonary exam normal       Cardiovascular Normal cardiovascular exam    Neuro/Psych    GI/Hepatic GERD-  Medicated and Controlled,  Endo/Other    Renal/GU      Musculoskeletal   Abdominal   Peds  Hematology   Anesthesia Other Findings   Reproductive/Obstetrics                             Anesthesia Physical Anesthesia Plan  ASA: II  Anesthesia Plan: General   Post-op Pain Management:    Induction: Intravenous  Airway Management Planned: LMA  Additional Equipment:   Intra-op Plan:   Post-operative Plan: Extubation in OR  Informed Consent: I have reviewed the patients History and Physical, chart, labs and discussed the procedure including the risks, benefits and alternatives for the proposed anesthesia with the patient or authorized representative who has indicated his/her understanding and acceptance.     Plan Discussed with: CRNA and Surgeon  Anesthesia Plan Comments:         Anesthesia Quick Evaluation

## 2015-04-19 NOTE — Discharge Instructions (Signed)

## 2015-04-19 NOTE — Progress Notes (Signed)
Assisted Dr. Ossey with right, ultrasound guided, pectoralis block. Side rails up, monitors on throughout procedure. See vital signs in flow sheet. Tolerated Procedure well. 

## 2015-04-19 NOTE — Op Note (Signed)
04/19/2015  5:26 PM  PATIENT:  Deborah Mercado  64 y.o. female  PRE-OPERATIVE DIAGNOSIS:  Right Breast Cancer  POST-OPERATIVE DIAGNOSIS:  Right Breast Cancer  PROCEDURE:  Procedure(s): BREAST LUMPECTOMY WITH RADIOACTIVE SEED AND SENTINEL LYMPH NODE MAPPING (Right)  SURGEON:  Surgeon(s) and Role:    * Jovita Kussmaul, MD - Primary  PHYSICIAN ASSISTANT:   ASSISTANTS: none   ANESTHESIA:   general  EBL:  Total I/O In: 1600 [I.V.:1600] Out: -   BLOOD ADMINISTERED:none  DRAINS: none   LOCAL MEDICATIONS USED:  MARCAINE     SPECIMEN:  Source of Specimen:  right breast tissue and sentinel nodes  DISPOSITION OF SPECIMEN:  PATHOLOGY  COUNTS:  YES  TOURNIQUET:  * No tourniquets in log *  DICTATION: .Dragon Dictation  After informed consent was obtained the patient was brought to the operating room and placed in the supine position on the operating room table. After adequate induction of general anesthesia the patient's right chest, breast, and axillary area were prepped with ChloraPrep, allowed to dry, and draped in usual sterile manner. Earlier in the day the patient underwent injection of 1 mCi of technetium sulfur colloid in the subareolar position on the right. Previously the patient had an I-125 seed placed in the upper outer quadrant of the right breast to mark the area of cancer. Attention was first turned to the right axilla. Using the neoprobe we identified a hot spot in the right axilla. A transversely oriented incision was made overlying the hot spot with a 15 blade knife. The incision was carried through the skin and subcutaneous tissue sharply with electrocautery until the axilla was entered. A [place] retractor was deployed. Using the neoprobe to direct blunt dissection we were able to identify 3 hot lymph nodes and what appeared to be 2 palpable lymph nodes. These were sent to pathology for further evaluation. There were no other hot or palpable lymph nodes identified  in the right axilla. The wound was infiltrated with quarter percent Marcaine. The deep layer of the wound was closed with interrupted 3-0 Vicryl stitches. The skin was then closed with a running 4-0 Monocryl subcuticular stitch. Attention was then turned to the right breast. A curvilinear incision was made along the edge of the areola in the upper and outer quadrant. The neoprobe was changed to I-125 from technetium. Using the neoprobe to check the area of radioactivity frequently I was able to remove a circular portion of breast tissue around the area of radioactivity. This was done sharply with the electrocautery. Once the specimen was removed it was oriented with the appropriate paint colors. The radioactive seed was confirmed in the specimen. There was no further radioactivity from I-125 in the breast. A specimen radiograph showed the clip and seed to be in the center of the specimen. The specimen was then sent to pathology for further evaluation. Hemostasis was achieved using the Bovie electrocautery. The wound was irrigated with copious amounts of saline and infiltrated with quarter percent Marcaine. The deep layer of the wound was closed in layers of 3-0 Vicryl stitches. The skin was then closed with interrupted 4-0 Monocryl subcuticular stitches. Dermabond dressings were applied. The patient tolerated the procedure well. At the end of the case all needle sponge and instrument counts were correct. The patient was then awakened and taken to recovery in stable condition.  PLAN OF CARE: Discharge to home after PACU  PATIENT DISPOSITION:  PACU - hemodynamically stable.   Delay start of Pharmacological  VTE agent (>24hrs) due to surgical blood loss or risk of bleeding: not applicable

## 2015-04-19 NOTE — Progress Notes (Signed)
nuc med injection performed by nuc med staff. Additional fentanyl given for sedation. VSS. Family called for visit

## 2015-04-19 NOTE — Transfer of Care (Signed)
Immediate Anesthesia Transfer of Care Note  Patient: Deborah Mercado  Procedure(s) Performed: Procedure(s): BREAST LUMPECTOMY WITH RADIOACTIVE SEED AND SENTINEL LYMPH NODE MAPPING (Right)  Patient Location: PACU  Anesthesia Type:GA combined with regional for post-op pain  Level of Consciousness: awake, alert  and patient cooperative  Airway & Oxygen Therapy: Patient Spontanous Breathing and Patient connected to face mask oxygen  Post-op Assessment: Report given to RN and Post -op Vital signs reviewed and stable  Post vital signs: Reviewed and stable  Last Vitals:  Filed Vitals:   04/19/15 1435  BP: 126/61  Pulse: 80  Temp:   Resp: 16    Complications: No apparent anesthesia complications

## 2015-04-22 ENCOUNTER — Encounter (HOSPITAL_BASED_OUTPATIENT_CLINIC_OR_DEPARTMENT_OTHER): Payer: Self-pay | Admitting: General Surgery

## 2015-04-23 ENCOUNTER — Encounter: Payer: Self-pay | Admitting: *Deleted

## 2015-04-23 NOTE — Progress Notes (Signed)
Ordered oncotype per Dr. Jana Hakim.  Faxed requisition to pathology and confirmed receipt with [facility].

## 2015-05-03 ENCOUNTER — Encounter (HOSPITAL_COMMUNITY): Payer: Self-pay

## 2015-05-03 ENCOUNTER — Telehealth: Payer: Self-pay | Admitting: *Deleted

## 2015-05-03 DIAGNOSIS — C50411 Malignant neoplasm of upper-outer quadrant of right female breast: Secondary | ICD-10-CM

## 2015-05-03 NOTE — Telephone Encounter (Signed)
Received oncotype score of 13/9%. Copy given to Dr. Jana Hakim. Original to [facility] for scanning.

## 2015-05-09 ENCOUNTER — Ambulatory Visit (HOSPITAL_BASED_OUTPATIENT_CLINIC_OR_DEPARTMENT_OTHER): Payer: 59 | Admitting: Oncology

## 2015-05-09 ENCOUNTER — Telehealth: Payer: Self-pay | Admitting: Oncology

## 2015-05-09 VITALS — BP 127/55 | HR 77 | Temp 98.0°F | Resp 20 | Ht 64.0 in | Wt 190.2 lb

## 2015-05-09 DIAGNOSIS — Z170 Estrogen receptor positive status [ER+]: Secondary | ICD-10-CM

## 2015-05-09 DIAGNOSIS — C50411 Malignant neoplasm of upper-outer quadrant of right female breast: Secondary | ICD-10-CM | POA: Diagnosis not present

## 2015-05-09 DIAGNOSIS — R208 Other disturbances of skin sensation: Secondary | ICD-10-CM

## 2015-05-09 MED ORDER — GABAPENTIN 300 MG PO CAPS: 300.0000 mg | ORAL_CAPSULE | Freq: Every day | ORAL | Status: DC

## 2015-05-09 NOTE — Progress Notes (Signed)
[place]  [phone] [phone]) [phone]     ID: [name] DOB: [birth year]  MR#: [id number]  FVW#:[id number]  Patient Care Team: Briscoe Deutscher, MD as PCP - General (Family Medicine) Kem Boroughs, FNP as Nurse Practitioner (Nurse Practitioner) Autumn Messing III, MD as Consulting Physician (General Surgery) Chauncey Cruel, MD as Consulting Physician (Oncology) Eppie Gibson, MD as Attending Physician (Radiation Oncology) Rockwell Germany, RN as Registered Nurse Mauro Kaufmann, RN as Registered Nurse Holley Bouche, NP as Nurse Practitioner (Nurse Practitioner) PCP: Abigail Miyamoto, MD GYN: Roe Coombs, NP OTHER MD:  CHIEF COMPLAINT: Estrogen receptor positive breast cancer  CURRENT TREATMENT: Awaiting adjuvant radiation   BREAST CANCER HISTORY: From the original intake note:  Deborah Mercado had routine bilateral screening mammography at the breast Center for 20 04/19/2015. This showed the breast density to be category B. A possible mass in the right breast with 2 diagnostic right mammography with tomosynthesis and right breast ultrasonography 04/01/2015. There was a small spiculated mass in the central right breast at approximately 11:30 o'clock. There was no palpable abnormality by exam. Ultrasound confirmed an irregularly marginated hypoechoic mass measuring 6 mm in the area in question. Ultrasound of the right axilla was unremarkable.  Biopsy of the mass in question was obtained 04/01/2015 and showed ([id number]) and invasive ductal carcinoma, grade 1, estrogen receptor 75% positive, progesterone receptor 90% positive, both with strong staining intensity, with an MIB-1 of 5%, and no HER-2 amplification, the signals ratio being 1.79 and the number per cell 2.60.  The patient's subsequent history is as detailed below  INTERVAL HISTORY: Deborah Mercado returns today for follow-up of her breast cancer. Since her last visit here she underwent right  lumpectomy and sentinel lymph node sampling on 04/19/2015. Final pathology ([id number]) confirmed an invasive ductal carcinoma, grade 1, measuring 1.6 cm. Margins were negative. All 5 sentinel lymph nodes were clear. In addition, an Oncotype DX was sent with a score of 13, predicting an outside the breast risk of recurrence within 10 years of 9% if the patient's only systemic therapy is tamoxifen for 5 years. It also predicts no benefit from chemotherapy.-- Her case was discussed again at the multidisciplinary breast cancer conference 05/08/2015 where radiation followed by hormone therapy was recommended.  REVIEW OF SYSTEMS: She did generally well with the surgery, except that she developed a little bit of swelling surrounding the axillary incision. This has subsided. She also has a burning feeling in the medial aspect of the right upper extremity. She is taking some oxycodone for this. This is not constipating her. She had no problems with bleeding or fever. She is "itching to get back to work". A detailed review of systems today was otherwise stable  PAST MEDICAL HISTORY: Past Medical History  Diagnosis Date  . Vitamin D deficiency   . SUI (stress urinary incontinence, female)   . Breast cancer   . GERD (gastroesophageal reflux disease)     PAST SURGICAL HISTORY: Past Surgical History  Procedure Laterality Date  . Breast lumpectomy with radioactive seed and sentinel lymph node biopsy Right 04/19/2015    Procedure: BREAST LUMPECTOMY WITH RADIOACTIVE SEED AND SENTINEL LYMPH NODE MAPPING;  Surgeon: Autumn Messing III, MD;  Location: [place];  Service: General;  Laterality: Right;    FAMILY HISTORY Family History  Problem Relation Age of Onset  . Heart disease Mother   . Rheum arthritis Mother   . Hypertension Mother   . Rheum arthritis  Maternal Grandmother   . Heart attack Maternal Grandmother   . Heart disease Maternal Grandmother    the patient's parents are still living,  in their mid 15s. The patient had no brothers, one sister. There is no history of breast or ovarian cancer in the family to her knowledge.  GYNECOLOGIC HISTORY:  Patient's last menstrual period was 11/30/2004. Menarche age 45, first live birth age 40. The patient is GX P1. She has been on hormone replacement until this diagnosis, with her last dose dose 04/05/2015.  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 She is divorced and lives I herself with 2 [name] and approximately 35 birds. Her daughter Deborah Mercado lives in [place] where she is supplied Electronics engineer for a [employer].    ADVANCED DIRECTIVES: Not in place   HEALTH MAINTENANCE: History  Substance Use Topics  . Smoking status: Never Smoker   . Smokeless tobacco: Never Used  . Alcohol Use: Yes     Comment: occ     Colonoscopy:  PAP:  Bone density:  Lipid panel:  Allergies  Allergen Reactions  . Influenza Vaccines   . Monistat [Miconazole] Other (See Comments)    Severe burning and pain with use    Current Outpatient Prescriptions  Medication Sig Dispense Refill  . oxyCODONE-acetaminophen (ROXICET) 5-325 MG per tablet Take 1-2 tablets by mouth every 4 (four) hours as needed for severe pain. 50 tablet 0  . pantoprazole (PROTONIX) 20 MG tablet Take 1 tablet (20 mg total) by mouth daily. 90 tablet 3  . solifenacin (VESICARE) 5 MG tablet Take 1 tablet (5 mg total) by mouth daily. 90 tablet 3  . Vitamin D, Ergocalciferol, (DRISDOL) 50000 UNITS CAPS capsule Take 1 capsule (50,000 Units total) by mouth every 7 (seven) days. 30 capsule 3   No current facility-administered medications for this visit.    OBJECTIVE: Middle-aged white woman who appears well Filed Vitals:   05/09/15 0812  BP: 127/55  Pulse: 77  Temp: 98 F (36.7 C)  Resp: 20     Body mass index is 32.63 kg/(m^2).    ECOG FS:1 - Symptomatic but completely ambulatory  Sclerae unicteric, pupils round and  equal Oropharynx clear and moist-- no thrush or other lesions No cervical or supraclavicular adenopathy Lungs no rales or rhonchi Heart regular rate and rhythm Abd soft, nontender, positive bowel sounds MSK no focal spinal tenderness, no upper extremity lymphedema Neuro: nonfocal, well oriented, appropriate affect Breasts: The right breast is status post recent lumpectomy. The breast incision has healed nicely and the cosmetic result is excellent. In the axilla I do not palpate any swelling or induration surrounding the scar. There is minimal erythema surrounding it. The left breast is unremarkable  LAB RESULTS:  CMP     Component Value Date/Time   NA 143 04/10/2015 1225   NA 142 03/18/2015 1454   K 4.2 04/10/2015 1225   K 3.9 03/18/2015 1454   CL 108 03/18/2015 1454   CO2 25 04/10/2015 1225   CO2 24 03/18/2015 1454   GLUCOSE 91 04/10/2015 1225   GLUCOSE 87 03/18/2015 1454   BUN 8.0 04/10/2015 1225   BUN 8 03/18/2015 1454   CREATININE 0.8 04/10/2015 1225   CREATININE 0.76 03/18/2015 1454   CALCIUM 9.0 04/10/2015 1225   CALCIUM 8.7 03/18/2015 1454   PROT 7.4 04/10/2015 1225   PROT 6.8 03/18/2015 1454   ALBUMIN 3.9 04/10/2015 1225   ALBUMIN 4.0 03/18/2015 1454  AST 20 04/10/2015 1225   AST 15 03/18/2015 1454   ALT 11 04/10/2015 1225   ALT 10 03/18/2015 1454   ALKPHOS 84 04/10/2015 1225   ALKPHOS 68 03/18/2015 1454   BILITOT 0.69 04/10/2015 1225   BILITOT 0.8 03/18/2015 1454    INo results found for: SPEP, UPEP  Lab Results  Component Value Date   WBC 6.8 04/10/2015   NEUTROABS 4.3 04/10/2015   HGB 13.9 04/10/2015   HCT 41.8 04/10/2015   MCV 86.2 04/10/2015   PLT 200 04/10/2015      Chemistry      Component Value Date/Time   NA 143 04/10/2015 1225   NA 142 03/18/2015 1454   K 4.2 04/10/2015 1225   K 3.9 03/18/2015 1454   CL 108 03/18/2015 1454   CO2 25 04/10/2015 1225   CO2 24 03/18/2015 1454   BUN 8.0 04/10/2015 1225   BUN 8 03/18/2015 1454    CREATININE 0.8 04/10/2015 1225   CREATININE 0.76 03/18/2015 1454      Component Value Date/Time   CALCIUM 9.0 04/10/2015 1225   CALCIUM 8.7 03/18/2015 1454   ALKPHOS 84 04/10/2015 1225   ALKPHOS 68 03/18/2015 1454   AST 20 04/10/2015 1225   AST 15 03/18/2015 1454   ALT 11 04/10/2015 1225   ALT 10 03/18/2015 1454   BILITOT 0.69 04/10/2015 1225   BILITOT 0.8 03/18/2015 1454       No results found for: LABCA2  No components found for: LABCA125  No results for input(s): INR in the last 168 hours.  Urinalysis    Component Value Date/Time   BILIRUBINUR neg 03/18/2015 1410   PROTEINUR neg 03/18/2015 1410   UROBILINOGEN negative 03/18/2015 1410   NITRITE neg 03/18/2015 1410   LEUKOCYTESUR Negative 03/18/2015 1410    STUDIES: Nm Sentinel Node Inj-no Rpt (breast)  04/19/2015   CLINICAL DATA: right breast cancer   Sulfur colloid was injected intradermally by the nuclear medicine  technologist for breast cancer sentinel node localization.    Mm Breast Surgical Specimen  04/19/2015   CLINICAL DATA:  Right post excisional specimen imaging  EXAM: SPECIMEN RADIOGRAPH OF THE right BREAST  COMPARISON:  Previous exam(s).  FINDINGS: Status post excision of the right breast. The radioactive seed and biopsy marker clip are present, completely intact, and were marked for pathology.  IMPRESSION: Specimen radiograph of the right breast.   Electronically Signed   By: Conchita Paris M.D.   On: 04/19/2015 17:02   Mm Rt Radioactive Seed Loc Mammo Guide  04/18/2015   CLINICAL DATA:  Patient with recent diagnosis right breast invasive ductal carcinoma. For preoperative localization.  EXAM: MAMMOGRAPHIC GUIDED RADIOACTIVE SEED LOCALIZATION OF THE RIGHT BREAST  COMPARISON:  Previous exam(s).  FINDINGS: Patient presents for radioactive seed localization prior to right breast lumpectomy. I met with the patient and we discussed the procedure of seed localization including benefits and alternatives. We  discussed the high likelihood of a successful procedure. We discussed the risks of the procedure including infection, bleeding, tissue injury and further surgery. We discussed the low dose of radioactivity involved in the procedure. Informed, written consent was given.  The usual time-out protocol was performed immediately prior to the procedure.  Using mammographic guidance, sterile technique, 2% lidocaine and an I-125 radioactive seed, biopsy marking clip within central right breast was was localized using a lateral approach. The follow-up mammogram images confirm the seed in the expected location and were marked for Dr. Marlou Starks.  Follow-up survey  of the patient confirms presence of the radioactive seed.  Order number of I-125 seed:  [id number].  Total activity:  0.242 mCi  Reference Date: 04/09/2015  The patient tolerated the procedure well and was released from the [place]. She was given instructions regarding seed removal.  IMPRESSION: Radioactive seed localization right breast. No apparent complications.   Electronically Signed   By: Lovey Newcomer M.D.   On: 04/18/2015 09:16    ASSESSMENT: 64 y.o. [place], [place] woman status post right breast biopsy 04/01/2015 for a clinicalT1b N0, stage IA invasive ductal carcinoma, grade 1, estrogen and progesterone receptor positive, HER-2 not amplified, with an MIB-1 of 5%  (1) status post right lumpectomy and sentinel lymph node sampling 04/19/2015 for a pT1c pN0, stage IA invasive ductal carcinoma, grade 1, with negative margins  (2) Oncotype DX score of 13 predicts a 10 year risk of outside the breast recurrence of 9% if the patient's only systemic therapy is tamoxifen for 5 years. It also predicts no benefit from chemotherapy  (3) radiation therapy will complete local therapy  (4) antiestrogen therapy to follow radiation  PLAN: I reviewed the pathology and Oncotype results with Deborah Mercado. She understands she had a stage I breast cancer, which was slow  growing and low-grade. The Oncotype score falls in the "low risk was "'s group, which means that women like her who took tamoxifen and also to chemotherapy did no better than the women who only took tamoxifen. Accordingly chemotherapy is not needed and would not be helpful. She is of course pleased at this result.  The Oncotype predicts a 91% chance of this cancer not coming back outside the breast within the next 10 years if she takes tamoxifen for 5 years. We have better options and that and we will discuss those when she returns to see me in about 2 months. The final numbers for her will be closer to 94% chance of disease-free survival at 10 years with optimal antiestrogen therapy.   She is now ready for adjuvant radiation. Because she works second shift she is hoping this can be done around 2 PM on a daily basis. She is dreading this, but I have reassured her that we have excellent equipment and that Dr. Isidore Moos is technically superb. Deborah Mercado is very eager to get back to work and I don't see any reason why she could not. She will discuss that with Dr. Ethlyn Gallery office.  She is receiving calls from genomic testing but she is not a candidate for genetic testing. I don't know why she is receiving those calls. I have asked her to right the number down and call our navigator to discuss.  As far as a burning feeling on the medial aspect of her right upper arm, with going to try gabapentin 300 mg at bedtime. This may make the oxycodone unnecessary. It also should help with her nighttime hot flashes. If she does really well with that we could consider some daytime gabapentin, at lower doses.  She will see me again in approximately 2 months. We will discuss anti-estrogens at that time. She will have a bone density prior to that visit.  Chauncey Cruel, MD   05/09/2015 8:19 AM Medical Oncology and Hematology [facility] [address] [place],  [address] Tel. [phone]    Fax.  [phone]

## 2015-05-09 NOTE — Telephone Encounter (Signed)
Will call back once has work schedule to confirm appointment.

## 2015-05-10 ENCOUNTER — Encounter: Payer: Self-pay | Admitting: Radiation Oncology

## 2015-05-13 ENCOUNTER — Other Ambulatory Visit: Payer: Self-pay | Admitting: Oncology

## 2015-05-30 ENCOUNTER — Encounter: Payer: Self-pay | Admitting: Radiation Oncology

## 2015-05-30 NOTE — Progress Notes (Addendum)
Location of Breast Cancer: Right Breast  Upper Outer  Quadrant  Histology per Pathology Report: Diagnosis 5/2/216: Breast, right, needle core biopsy, right upper outer quadrant- INVASIVE DUCTAL CARCINOMA.  Receptor Status: ER( + 75%), PR ( + 90% ), Her2-neu (   )  Did patient present with symptoms (if so, please note symptoms) or was this found on screening mammography?: Routine B/L mammogram screening  Past/Anticipated interventions by surgeon, if LTE:[id number] 04/19/15: Dr. Daryel November, III MD 1. Lymph node, sentinel, biopsy, right axillary #1- ONE LYMPH NODE, NEGATIVE FOR TUMOR (0/1) 2. Lymph node, sentinel, biopsy, right axillary #2- ONE LYMPH NODE, NEGATIVE FOR TUMOR (0/1) 3. Lymph node, sentinel, biopsy, right axillary #3- ONE LYMPH NODE, NEGATIVE FOR TUMOR (0/1) 4. Lymph node, sentinel, biopsy, right axillary #4- ONE LYMPH NODE, NEGATIVE FOR TUMOR (0/1) 5. Lymph node, sentinel, biopsy, right axillary #5- ONE LYMPH NODE, NEGATIVE FOR TUMOR (0/1) 6. Breast, lumpectomy, right- INVASIVE DUCTAL CARCINOMA, SEE COMMENT.- NEGATIVE FOR LYMPH VASCULAR INVASION.- INVASIVE TUMOR IS 2 MM FROM NEAREST MARGIN (SUPERIOR) - PREVIOUS BIOPSY SITE.   Past/Anticipated interventions by medical oncology, if any: Chemotherapy :seen in Breast Clinic 04/10/15 Dr. Jana Hakim, Oncotype DX result=13, follow up 05/09/15, radiation followed by hormone therapy recommended  On 05/08/15 at breast cancer conference,  Bone density scheduled for  06/10/15, follow up 07/02/15 with Dr. Jana Hakim  Lymphedema issues, if any:  no  Pain issues, if any: Medial aspect   Right upper extremity burning, numbness  SAFETY ISSUES:  Prior radiation? NO  Pacemaker/ICD? NO  Possible current pregnancy? NO Is the patient on methotrexate?  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 Notified physician and contacted Abby Potash in social work. Abby Potash states she will be in contact with patient asap

## 2015-05-31 ENCOUNTER — Ambulatory Visit
Admission: RE | Admit: 2015-05-31 | Discharge: 2015-05-31 | Disposition: A | Discharge status: Home / Self Care | Payer: 59 | Source: Ambulatory Visit | Attending: Radiation Oncology | Admitting: Radiation Oncology

## 2015-05-31 ENCOUNTER — Encounter: Payer: Self-pay | Admitting: Radiation Oncology

## 2015-05-31 ENCOUNTER — Encounter: Payer: Self-pay | Admitting: *Deleted

## 2015-05-31 VITALS — BP 137/76 | HR 77 | Temp 97.8°F | Resp 12 | Wt 191.6 lb

## 2015-05-31 DIAGNOSIS — C50411 Malignant neoplasm of upper-outer quadrant of right female breast: Secondary | ICD-10-CM

## 2015-05-31 DIAGNOSIS — Z79899 Other long term (current) drug therapy: Secondary | ICD-10-CM | POA: Diagnosis not present

## 2015-05-31 DIAGNOSIS — Z510 Encounter for antineoplastic radiation therapy: Secondary | ICD-10-CM | POA: Diagnosis not present

## 2015-05-31 HISTORY — DX: Allergy, unspecified, initial encounter: T78.40XA

## 2015-05-31 NOTE — Progress Notes (Signed)
[place] Psychosocial Distress Screening Clinical Social Work  Clinical Social Work was referred by distress screening protocol and Therapist, sports.  The patient scored a 8 on the Psychosocial Distress Thermometer which indicates severe distress. Clinical Social Worker reviewed chart, spoke with MD and met briefly with pt prior to [facility] to assess for distress and other psychosocial needs. Per MD, pt seemed calm and ok with proceeding with SIM today. CSW met with pt briefly at [facility] and introduced self, explained role of CSW. CSW also provided pt with handouts on Support Team and additional resources to assist. CSW explained she could return after [facility], if pt requested. Pt encouraged to reach out as needed.  ONCBCN DISTRESS SCREENING 05/31/2015  Screening Type Initial Screening  Distress experienced in past week (1-10) 8  Emotional problem type Adjusting to illness  Information Concerns Type   Physical Problem type Sleep/insomnia  Physician notified of physical symptoms Yes  Referral to clinical psychology Yes  Referral to support programs   Other    Clinical Social Worker follow up needed: Yes.    If yes, follow up plan: See above Loren Racer, [place] Worker [place]  [facility] Phone: [phone] Fax: [phone]

## 2015-05-31 NOTE — Progress Notes (Signed)
Radiation Oncology         [phone] ________________________________  Name: STARKISHA TULLIS MRN: [id number]  Date: 05/31/2015  DOB: 1951-04-04  Follow-Up Visit Note  outpatient  CC: FRIED, Jaymes Graff, MD  Jovita Kussmaul, MD  Diagnosis and Prior Radiotherapy:    ICD-9-CM ICD-10-CM   1. Breast cancer of upper-outer quadrant of right female breast 174.4 C50.411    Right breast invasive ductal carcinoma Stage I pTIcN0 cM0 ER 75%, PR 90%, and HER2 negative  Narrative:  The patient returns today for routine follow-up. She underwent lumpectomy and sentinel lymph node biopsy on 04/19/15. This revealed Grade I disease as described in the diagnosis section. All 5 nodes were negative and her margins are clear. She saw medical oncology and her oncotype was low risk. She will no receive chemotherapy. She reports some right upper extremity burning and numbness.          ALLERGIES:  is allergic to influenza vaccines and monistat.  Meds: Current Outpatient Prescriptions  Medication Sig Dispense Refill  . pantoprazole (PROTONIX) 20 MG tablet Take 1 tablet (20 mg total) by mouth daily. 90 tablet 3  . solifenacin (VESICARE) 5 MG tablet Take 1 tablet (5 mg total) by mouth daily. 90 tablet 3  . Vitamin D, Ergocalciferol, (DRISDOL) 50000 UNITS CAPS capsule Take 1 capsule (50,000 Units total) by mouth every 7 (seven) days. 30 capsule 3  . gabapentin (NEURONTIN) 300 MG capsule Take 1 capsule (300 mg total) by mouth at bedtime. (Patient not taking: Reported on 05/31/2015) 90 capsule 4  . oxyCODONE-acetaminophen (ROXICET) 5-325 MG per tablet Take 1-2 tablets by mouth every 4 (four) hours as needed for severe pain. (Patient not taking: Reported on 05/31/2015) 50 tablet 0   No current facility-administered medications for this encounter.    Physical Findings: The patient is in no acute distress. Patient is alert and oriented.  weight is 191 lb 9.6 oz (86.909 kg). Her oral temperature is 97.8 F (36.6 C).  Her blood pressure is 137/76 and her pulse is 77. Her respiration is 12 and oxygen saturation is 98%. .    Right lumpectomy and axillary scars have healed well. She has good ROM in her right arm.  Lab Findings: Lab Results  Component Value Date   WBC 6.8 04/10/2015   HGB 13.9 04/10/2015   HCT 41.8 04/10/2015   MCV 86.2 04/10/2015   PLT 200 04/10/2015    Radiographic Findings: No results found.  Impression/Plan: Today, I talked to the patient about the findings and work-up thus far. We discussed the patient's diagnosis of right breast invasive ductal carcinoma Stage I pTIcN0 cM0 and general treatment for this, highlighting the role of radiotherapy in the management. We discussed the available radiation techniques, and focused on the details of logistics and delivery.    We discussed the risks, benefits, and side effects of radiotherapy. Side effects may include but not necessarily be limited to: breast swelling, skin irritation No guarantees of treatment were given. A consent form was signed and placed in the patient's medical record. The patient was encouraged to ask questions that I answered to the best of my ability.  The pt works second shift at [employer] between 3 and 11 PM and would like to be treated before that time frame. I will write a letter to the pt's supervisor to notify them about her treatment scheduling.   Simulation today.   This document serves as a record of services personally performed by  Eppie Gibson, MD. It was created on her behalf by Darcus Austin, a trained medical scribe. The creation of this record is based on the scribe's personal observations and the provider's statements to them. This document has been checked and approved by the attending provider.     _____________________________________   Eppie Gibson, MD

## 2015-05-31 NOTE — Progress Notes (Signed)
  Radiation Oncology         [phone] ________________________________  Name: Deborah Mercado MRN: [id number]  Date: 05/31/2015  DOB: 12-13-1950  SIMULATION AND TREATMENT PLANNING NOTE    outpatient  DIAGNOSIS:     ICD-9-CM ICD-10-CM   1. Breast cancer of upper-outer quadrant of right female breast 174.4 C50.411     NARRATIVE:  The patient was brought to the [place].  Identity was confirmed.  All relevant records and images related to the planned course of therapy were reviewed.  The patient freely provided informed written consent to proceed with treatment after reviewing the details related to the planned course of therapy. The consent form was witnessed and verified by the simulation staff.    Then, the patient was set-up in a stable reproducible supine position for radiation therapy with her ipsilateral arm over her head, and her upper body secured in a custom-made Vac-lok device.  CT images were obtained.  Surface markings were placed.  The CT images were loaded into the planning software.    TREATMENT PLANNING NOTE: Treatment planning then occurred.  The radiation prescription was entered and confirmed.      A total of 3 medically necessary complex treatment devices were fabricated and supervised by me: 2 fields with MLCs for custom blocks to protect heart, and lungs;  and, a Vac-lok. I have requested : 3D Simulation  I have requested a DVH of the following structures: lungs, heart, lumpectomy cavity.    The patient will receive 50 Gy in 25 fractions to the  Right breast with 2 tangential fields.   This will  be followed by a boost.  Optical Surface Tracking Plan:  Since intensity modulated radiotherapy (IMRT) and 3D conformal radiation treatment methods are predicated on accurate and precise positioning for treatment, intrafraction motion monitoring is medically necessary to ensure accurate and safe treatment delivery. The ability to quantify  intrafraction motion without excessive ionizing radiation dose can only be performed with optical surface tracking. Accordingly, surface imaging offers the opportunity to obtain 3D measurements of patient position throughout IMRT and 3D treatments without excessive radiation exposure. I am ordering optical surface tracking for this patient's upcoming course of radiotherapy.  ________________________________   Reference:  Ursula Alert, J, et al. Surface imaging-based analysis of intrafraction motion for breast radiotherapy patients.Journal of [place], n. 6, nov. 2014. ISSN [id number].  Available at: <http://www.jacmp.org/index.php/jacmp/article/view/4957>.    -----------------------------------  Eppie Gibson, MD

## 2015-06-05 ENCOUNTER — Encounter: Payer: Self-pay | Admitting: Radiation Oncology

## 2015-06-05 DIAGNOSIS — C50411 Malignant neoplasm of upper-outer quadrant of right female breast: Secondary | ICD-10-CM | POA: Diagnosis not present

## 2015-06-05 NOTE — Progress Notes (Signed)
3-D simulation note: The patient completed 3-D simulation for treatment to her right breast.  She was set up to tangential fields.  2 sets of unique MLCs and 2 sets of electronic compensators are employed to deliver 5000 cGy in 25 sessions.  Dose volume histograms were obtained for the avoidance structures including the lungs and heart and also target structures including her tumor bed.  We met our departmental guidelines.  She is being treated with mixed 6 MV/10 MV photons.

## 2015-06-06 DIAGNOSIS — C50411 Malignant neoplasm of upper-outer quadrant of right female breast: Secondary | ICD-10-CM | POA: Diagnosis not present

## 2015-06-10 ENCOUNTER — Ambulatory Visit
Admission: RE | Admit: 2015-06-10 | Discharge: 2015-06-10 | Disposition: A | Discharge status: Home / Self Care | Payer: 59 | Source: Ambulatory Visit | Attending: Radiation Oncology | Admitting: Radiation Oncology

## 2015-06-10 ENCOUNTER — Other Ambulatory Visit: Payer: 59

## 2015-06-10 DIAGNOSIS — C50411 Malignant neoplasm of upper-outer quadrant of right female breast: Secondary | ICD-10-CM | POA: Diagnosis not present

## 2015-06-11 ENCOUNTER — Ambulatory Visit
Admission: RE | Admit: 2015-06-11 | Discharge: 2015-06-11 | Disposition: A | Discharge status: Home / Self Care | Payer: 59 | Source: Ambulatory Visit | Attending: Radiation Oncology | Admitting: Radiation Oncology

## 2015-06-11 ENCOUNTER — Encounter: Payer: Self-pay | Admitting: *Deleted

## 2015-06-11 ENCOUNTER — Other Ambulatory Visit: Payer: Self-pay | Admitting: *Deleted

## 2015-06-11 DIAGNOSIS — C50411 Malignant neoplasm of upper-outer quadrant of right female breast: Secondary | ICD-10-CM | POA: Diagnosis not present

## 2015-06-11 MED ORDER — LORAZEPAM 0.5 MG PO TABS: 0.2500 mg | ORAL_TABLET | ORAL | Status: DC

## 2015-06-11 MED ORDER — DIPHENHYDRAMINE HCL (SLEEP) 25 MG PO CAPS: 25.0000 mg | ORAL_CAPSULE | Freq: Every day | ORAL | Status: DC

## 2015-06-11 NOTE — Telephone Encounter (Signed)
Per Dr. Jana Hakim, I sent a prescription for Benadryl (for sleep) and Ativan (for anxiety) to [place]. Patient stated,"I'm not thrilled with using Benadryl for sleep, but I will try it. 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 Also, if the Ativan or Benadryl doesn't work for her, please call the office. Patient verbalized understanding.

## 2015-06-11 NOTE — Progress Notes (Signed)
This RN placed FMLA paperwork in managed care department.

## 2015-06-12 ENCOUNTER — Ambulatory Visit
Admission: RE | Admit: 2015-06-12 | Discharge: 2015-06-12 | Disposition: A | Discharge status: Home / Self Care | Payer: 59 | Source: Ambulatory Visit | Attending: Radiation Oncology | Admitting: Radiation Oncology

## 2015-06-12 DIAGNOSIS — C50411 Malignant neoplasm of upper-outer quadrant of right female breast: Secondary | ICD-10-CM | POA: Diagnosis not present

## 2015-06-12 MED ORDER — RADIAPLEXRX EX GEL
Freq: Once | CUTANEOUS | Status: AC
  Administered 2015-06-12: 18:00:00 via TOPICAL

## 2015-06-12 MED ORDER — ALRA NON-METALLIC DEODORANT (RAD-ONC)
1.0000 "application " | Freq: Once | TOPICAL | Status: AC
  Administered 2015-06-12: 1 via TOPICAL

## 2015-06-12 NOTE — Progress Notes (Addendum)
RADIATION TREATMENT  SKIN CARE-BREAST    RECOMMENDATIONS: ? Use unscented soap (Dove) ? When showering it is fine for water to touch the area, but please avoid direct spray on the treatment field if skin becomes irritated.  Also, wash inside and around the marked area ? When drying gently blot the area  ? PLEASE DO NOT APPLY ANY OTHER CREAMS, LOTIONS, POWDERS, PERFUMES, OILS OR ALCOHOL PRODUCTS (OTHER THAN WHAT IS GIVEN TO YOU BY THE RADIATION TEAM) TO THE TREATMENT AREA DURING RADIATION THERAPY   SKIN CARE: ? Moisturizer o You will be given (Radiaplex Gel) to use. Apply twice daily, once after treatment and then again prior to bedtime o Your Radiation Oncologist may suggest other skin care products as needed  DEODORANT:  ? Nursing will provide deodorant Jethro Poling) to be used during your radiation treatment  Deodorant Alternatives: ? A combination of equal amounts of baking soda and corn starch.  Mix together and powder puff on ? [place]oms of [place]-available at [employer] ([facility] [place]) ? [place] Crystal Deodorant Mist-100% Natural Mineral Salts and Purified water ([facility])   PLEASE DO NOT APPLY THE RADIAPLEX GEL OR ALRA DEODORANT WITHIN 4 HOURS PRIOR TO RADIATION TREATMENT Pt here for patient teaching.  Pt given Radiation and You booklet, skin care instructions, Alra deodorant and Radiaplex gel. Reviewed areas of pertinence such as fatigue, hair loss, skin changes, breast tenderness and breast swelling . Pt able to give teach back of to pat skin and use unscented/gentle soap,apply Radiaplex bid, avoid applying anything to skin within 4 hours of treatment, avoid wearing an under wire bra and to use an electric razor if they must shave. Pt demonstrated understanding of information given and will contact nursing with any questions or concerns.

## 2015-06-13 ENCOUNTER — Ambulatory Visit
Admission: RE | Admit: 2015-06-13 | Discharge: 2015-06-13 | Disposition: A | Discharge status: Home / Self Care | Payer: 59 | Source: Ambulatory Visit | Attending: Radiation Oncology | Admitting: Radiation Oncology

## 2015-06-13 ENCOUNTER — Encounter: Payer: Self-pay | Admitting: Radiation Oncology

## 2015-06-13 DIAGNOSIS — C50411 Malignant neoplasm of upper-outer quadrant of right female breast: Secondary | ICD-10-CM | POA: Diagnosis not present

## 2015-06-13 NOTE — Progress Notes (Signed)
Rec'd disability paperwork from [employer]. Given to the nurse for Dr. Isidore Moos.

## 2015-06-14 ENCOUNTER — Encounter: Payer: Self-pay | Admitting: Radiation Oncology

## 2015-06-14 ENCOUNTER — Ambulatory Visit
Admission: RE | Admit: 2015-06-14 | Discharge: 2015-06-14 | Disposition: A | Discharge status: Home / Self Care | Payer: 59 | Source: Ambulatory Visit | Attending: Radiation Oncology | Admitting: Radiation Oncology

## 2015-06-14 DIAGNOSIS — C50411 Malignant neoplasm of upper-outer quadrant of right female breast: Secondary | ICD-10-CM | POA: Diagnosis not present

## 2015-06-14 NOTE — Progress Notes (Signed)
I emailed fmla forms to [employer] for dr. Isidore Moos

## 2015-06-17 ENCOUNTER — Ambulatory Visit
Admission: RE | Admit: 2015-06-17 | Discharge: 2015-06-17 | Disposition: A | Discharge status: Home / Self Care | Payer: 59 | Source: Ambulatory Visit | Attending: Radiation Oncology | Admitting: Radiation Oncology

## 2015-06-17 ENCOUNTER — Encounter: Payer: Self-pay | Admitting: Radiation Oncology

## 2015-06-17 VITALS — BP 147/76 | HR 77 | Temp 98.0°F | Resp 12 | Wt 191.8 lb

## 2015-06-17 DIAGNOSIS — C50411 Malignant neoplasm of upper-outer quadrant of right female breast: Secondary | ICD-10-CM

## 2015-06-17 NOTE — Progress Notes (Signed)
PAIN: She is currently in no pain.  SKIN: Pt right breast- warm dry and intact.  Pt denies edema.  Pt continues to apply Radiaplex as directed. OTHER: Pt complains of fatigue. BP 147/76 mmHg  Pulse 77  Temp(Src) 98 F (36.7 C) (Oral)  Resp 12  Wt 191 lb 12.8 oz (87 kg)  SpO2 96%  LMP 11/30/2004 Wt Readings from Last 3 Encounters:  06/17/15 191 lb 12.8 oz (87 kg)  05/31/15 191 lb 9.6 oz (86.909 kg)  05/09/15 190 lb 3.2 oz (86.274 kg)

## 2015-06-17 NOTE — Progress Notes (Signed)
   Weekly Management Note:  outpatient    ICD-9-CM ICD-10-CM   1. Breast cancer of upper-outer quadrant of right female breast 174.4 C50.411     Current Dose:  10 Gy  Projected Dose: 60 Gy   Narrative:  The patient presents for routine under treatment assessment.  CBCT/MVCT images/Port film x-rays were reviewed.  The chart was checked. No new complaints. Has hot flashes, still  Physical Findings:  weight is 191 lb 12.8 oz (87 kg). Her oral temperature is 98 F (36.7 C). Her blood pressure is 147/76 and her pulse is 77. Her respiration is 12 and oxygen saturation is 96%.   Wt Readings from Last 3 Encounters:  06/17/15 191 lb 12.8 oz (87 kg)  05/31/15 191 lb 9.6 oz (86.909 kg)  05/09/15 190 lb 3.2 oz (86.274 kg)   NAD, mild erythema of right breast  Impression:  The patient is tolerating radiotherapy.  Plan:  Continue radiotherapy as planned.    ________________________________   Eppie Gibson, M.D.

## 2015-06-18 ENCOUNTER — Ambulatory Visit
Admission: RE | Admit: 2015-06-18 | Discharge: 2015-06-18 | Disposition: A | Discharge status: Home / Self Care | Payer: 59 | Source: Ambulatory Visit | Attending: Radiation Oncology | Admitting: Radiation Oncology

## 2015-06-18 DIAGNOSIS — C50411 Malignant neoplasm of upper-outer quadrant of right female breast: Secondary | ICD-10-CM | POA: Diagnosis not present

## 2015-06-19 ENCOUNTER — Ambulatory Visit
Admission: RE | Admit: 2015-06-19 | Discharge: 2015-06-19 | Disposition: A | Discharge status: Home / Self Care | Payer: 59 | Source: Ambulatory Visit | Attending: Radiation Oncology | Admitting: Radiation Oncology

## 2015-06-19 DIAGNOSIS — C50411 Malignant neoplasm of upper-outer quadrant of right female breast: Secondary | ICD-10-CM | POA: Diagnosis not present

## 2015-06-20 ENCOUNTER — Ambulatory Visit
Admission: RE | Admit: 2015-06-20 | Discharge: 2015-06-20 | Disposition: A | Discharge status: Home / Self Care | Payer: 59 | Source: Ambulatory Visit | Attending: Radiation Oncology | Admitting: Radiation Oncology

## 2015-06-20 DIAGNOSIS — C50411 Malignant neoplasm of upper-outer quadrant of right female breast: Secondary | ICD-10-CM | POA: Diagnosis not present

## 2015-06-21 ENCOUNTER — Ambulatory Visit
Admission: RE | Admit: 2015-06-21 | Discharge: 2015-06-21 | Disposition: A | Discharge status: Home / Self Care | Payer: 59 | Source: Ambulatory Visit | Attending: Radiation Oncology | Admitting: Radiation Oncology

## 2015-06-21 DIAGNOSIS — C50411 Malignant neoplasm of upper-outer quadrant of right female breast: Secondary | ICD-10-CM | POA: Diagnosis not present

## 2015-06-24 ENCOUNTER — Encounter: Payer: Self-pay | Admitting: Radiation Oncology

## 2015-06-24 ENCOUNTER — Ambulatory Visit
Admission: RE | Admit: 2015-06-24 | Discharge: 2015-06-24 | Disposition: A | Discharge status: Home / Self Care | Payer: 59 | Source: Ambulatory Visit | Attending: Radiation Oncology | Admitting: Radiation Oncology

## 2015-06-24 VITALS — BP 122/57 | HR 77 | Temp 98.2°F | Wt 193.5 lb

## 2015-06-24 DIAGNOSIS — C50411 Malignant neoplasm of upper-outer quadrant of right female breast: Secondary | ICD-10-CM

## 2015-06-24 NOTE — Progress Notes (Addendum)
Deborah Mercado has received 10 fractions to her right breast. She reports continued numbness to her right, inner, upper arm,.  Mild redness of right, lower axilla.  She is lifting weights.

## 2015-06-24 NOTE — Progress Notes (Signed)
Weekly Management Note:  Site: right breast Current Dose:   2000  cGy Projected Dose:  5000  cGy  Followed by 5 fraction boost  Narrative: The patient is seen today for routine under treatment assessment. CBCT/MVCT images/port films were reviewed. The chart was reviewed.    She is without complaints today. She has Radioplex gel to use as needed.  Physical Examination:  Filed Vitals:   06/24/15 1410  BP: 122/57  Pulse: 77  Temp: 98.2 F (36.8 C)  .  Weight: 193 lb 8 oz (87.771 kg).  There is faint erythema along the right breast with no areas of desquamation.  Impression: Tolerating radiation therapy well.  Plan: Continue radiation therapy as planned.

## 2015-06-24 NOTE — Progress Notes (Signed)
Faxed to [employer] at [phone] fmla forms completed by Dr. Isidore Moos and the nurse. Originals are given back to Ms. Warmoth on L3 and copies are scanned.

## 2015-06-25 ENCOUNTER — Telehealth: Payer: Self-pay | Admitting: *Deleted

## 2015-06-25 ENCOUNTER — Ambulatory Visit
Admission: RE | Admit: 2015-06-25 | Discharge: 2015-06-25 | Disposition: A | Discharge status: Home / Self Care | Payer: 59 | Source: Ambulatory Visit | Attending: Radiation Oncology | Admitting: Radiation Oncology

## 2015-06-25 DIAGNOSIS — C50411 Malignant neoplasm of upper-outer quadrant of right female breast: Secondary | ICD-10-CM | POA: Diagnosis not present

## 2015-06-25 NOTE — Telephone Encounter (Signed)
Left vm for pt to return call regarding needs during xrt.

## 2015-06-26 ENCOUNTER — Ambulatory Visit
Admission: RE | Admit: 2015-06-26 | Discharge: 2015-06-26 | Disposition: A | Discharge status: Home / Self Care | Payer: 59 | Source: Ambulatory Visit | Attending: Radiation Oncology | Admitting: Radiation Oncology

## 2015-06-26 DIAGNOSIS — C50411 Malignant neoplasm of upper-outer quadrant of right female breast: Secondary | ICD-10-CM | POA: Diagnosis not present

## 2015-06-27 ENCOUNTER — Telehealth: Payer: Self-pay | Admitting: *Deleted

## 2015-06-27 ENCOUNTER — Ambulatory Visit
Admission: RE | Admit: 2015-06-27 | Discharge: 2015-06-27 | Disposition: A | Discharge status: Home / Self Care | Payer: 59 | Source: Ambulatory Visit | Attending: Radiation Oncology | Admitting: Radiation Oncology

## 2015-06-27 DIAGNOSIS — C50411 Malignant neoplasm of upper-outer quadrant of right female breast: Secondary | ICD-10-CM | POA: Diagnosis not present

## 2015-06-27 NOTE — Telephone Encounter (Signed)
Spoke to pt concerning needs during xrt. Relate doing well and without complaints. Encourage pt to call with questions or concerns. Received verbal understanding. Contact information given.

## 2015-06-28 ENCOUNTER — Ambulatory Visit
Admission: RE | Admit: 2015-06-28 | Discharge: 2015-06-28 | Disposition: A | Discharge status: Home / Self Care | Payer: 59 | Source: Ambulatory Visit | Attending: Radiation Oncology | Admitting: Radiation Oncology

## 2015-06-28 DIAGNOSIS — C50411 Malignant neoplasm of upper-outer quadrant of right female breast: Secondary | ICD-10-CM | POA: Diagnosis not present

## 2015-07-01 ENCOUNTER — Ambulatory Visit
Admission: RE | Admit: 2015-07-01 | Discharge: 2015-07-01 | Disposition: A | Discharge status: Home / Self Care | Payer: 59 | Source: Ambulatory Visit | Attending: Radiation Oncology | Admitting: Radiation Oncology

## 2015-07-01 ENCOUNTER — Encounter: Payer: Self-pay | Admitting: Radiation Oncology

## 2015-07-01 VITALS — BP 141/79 | HR 78 | Temp 98.2°F | Ht 64.0 in | Wt 192.2 lb

## 2015-07-01 DIAGNOSIS — C50411 Malignant neoplasm of upper-outer quadrant of right female breast: Secondary | ICD-10-CM

## 2015-07-01 MED ORDER — RADIAPLEXRX EX GEL
Freq: Once | CUTANEOUS | Status: AC
  Administered 2015-07-01: 14:00:00 via TOPICAL

## 2015-07-01 NOTE — Progress Notes (Signed)
   Department of Radiation Oncology  Phone:  [phone] Fax:        [phone]  Weekly Treatment Note  Name: Deborah Mercado Date: 07/01/2015 MRN: [id number] DOB: February 26, 1951   Current dose: 30 Gy  Current fraction:15  MEDICATIONS: Current Outpatient Prescriptions  Medication Sig Dispense Refill  . LORazepam (ATIVAN) 0.5 MG tablet Take 0.5 tablets (0.25 mg total) by mouth as directed. As needed for hot flashes. Do not exceed more than three times a week. 30 tablet 0  . pantoprazole (PROTONIX) 20 MG tablet Take 1 tablet (20 mg total) by mouth daily. 90 tablet 3  . solifenacin (VESICARE) 5 MG tablet Take 1 tablet (5 mg total) by mouth daily. 90 tablet 3  . Vitamin D, Ergocalciferol, (DRISDOL) 50000 UNITS CAPS capsule Take 1 capsule (50,000 Units total) by mouth every 7 (seven) days. 30 capsule 3  . DiphenhydrAMINE HCl, Sleep, 25 MG CAPS Take 25 mg by mouth at bedtime. (Patient not taking: Reported on 06/24/2015) 100 capsule 1  . oxyCODONE-acetaminophen (ROXICET) 5-325 MG per tablet Take 1-2 tablets by mouth every 4 (four) hours as needed for severe pain. (Patient not taking: Reported on 05/31/2015) 50 tablet 0   No current facility-administered medications for this encounter.     ALLERGIES: Influenza vaccines and Monistat   LABORATORY DATA:  Lab Results  Component Value Date   WBC 6.8 04/10/2015   HGB 13.9 04/10/2015   HCT 41.8 04/10/2015   MCV 86.2 04/10/2015   PLT 200 04/10/2015   Lab Results  Component Value Date   NA 143 04/10/2015   K 4.2 04/10/2015   CL 108 03/18/2015   CO2 25 04/10/2015   Lab Results  Component Value Date   ALT 11 04/10/2015   AST 20 04/10/2015   ALKPHOS 84 04/10/2015   BILITOT 0.69 04/10/2015     NARRATIVE: Deborah Mercado was seen today for weekly treatment management. The chart was checked and the patient's films were reviewed.  Ms, Cake has received 15 fractions to her right breast.  Note mild hyperpigmentation with  intact, soft skin. Reports level 8 tenderness to her breast. She reports fatigue with naps.   PHYSICAL EXAMINATION: height is 5\' 4"  (1.626 m) and weight is 192 lb 3.2 oz (87.181 kg). Her temperature is 98.2 F (36.8 C). Her blood pressure is 141/79 and her pulse is 78.       Breast: Mild diffuse erythema in breast area with no desquamation.   ASSESSMENT: The patient is doing satisfactorily with treatment.  PLAN: We will continue with the patient's radiation treatment as planned.     ------------------------------------------------  Deborah Gross, MD, PhD  This document serves as a record of services personally performed by Kyung Rudd, MD. It was created on his behalf by Derek Mound, a trained medical scribe. The creation of this record is based on the scribe's personal observations and the provider's statements to them. This document has been checked and approved by the attending provider.

## 2015-07-01 NOTE — Progress Notes (Signed)
Ms, Deborah Mercado has received 15 fractions to her right breast.  Note mild hyperpigmentation with intact, soft skin.  Reports level 8 tenderness to her breast.  She reports fatigue with naps.

## 2015-07-02 ENCOUNTER — Telehealth: Payer: Self-pay | Admitting: Oncology

## 2015-07-02 ENCOUNTER — Ambulatory Visit (HOSPITAL_BASED_OUTPATIENT_CLINIC_OR_DEPARTMENT_OTHER): Payer: 59 | Admitting: Oncology

## 2015-07-02 ENCOUNTER — Ambulatory Visit
Admission: RE | Admit: 2015-07-02 | Discharge: 2015-07-02 | Disposition: A | Discharge status: Home / Self Care | Payer: 59 | Source: Ambulatory Visit | Attending: Radiation Oncology | Admitting: Radiation Oncology

## 2015-07-02 VITALS — BP 141/83 | HR 79 | Temp 98.0°F | Resp 18 | Ht 64.0 in | Wt 192.3 lb

## 2015-07-02 DIAGNOSIS — N951 Menopausal and female climacteric states: Secondary | ICD-10-CM

## 2015-07-02 DIAGNOSIS — Z170 Estrogen receptor positive status [ER+]: Secondary | ICD-10-CM

## 2015-07-02 DIAGNOSIS — C50411 Malignant neoplasm of upper-outer quadrant of right female breast: Secondary | ICD-10-CM | POA: Diagnosis not present

## 2015-07-02 MED ORDER — VENLAFAXINE HCL ER 37.5 MG PO CP24: 37.5000 mg | ORAL_CAPSULE | Freq: Every day | ORAL | Status: DC

## 2015-07-02 MED ORDER — GABAPENTIN 100 MG PO CAPS: 100.0000 mg | ORAL_CAPSULE | Freq: Every day | ORAL | Status: DC

## 2015-07-02 MED ORDER — ANASTROZOLE 1 MG PO TABS: 1.0000 mg | ORAL_TABLET | Freq: Every day | ORAL | Status: DC

## 2015-07-02 NOTE — Progress Notes (Signed)
[place] [place]  [phone] [phone]     ID: Deborah Mercado DOB: July 31, 1951  MR#: [id number]  MGN#:[id number]  Patient Care Team: Briscoe Deutscher, MD as PCP - General (Family Medicine) Kem Boroughs, FNP as Nurse Practitioner (Nurse Practitioner) Autumn Messing III, MD as Consulting Physician (General Surgery) Chauncey Cruel, MD as Consulting Physician (Oncology) Eppie Gibson, MD as Attending Physician (Radiation Oncology) Rockwell Germany, RN as Registered Nurse Mauro Kaufmann, RN as Registered Nurse Holley Bouche, NP as Nurse Practitioner (Nurse Practitioner) PCP: Abigail Miyamoto, MD GYN: Roe Coombs, NP OTHER MD:  CHIEF COMPLAINT: Estrogen receptor positive breast cancer  CURRENT TREATMENT:  adjuvant radiation   BREAST CANCER HISTORY: From the original intake note:  [name] had routine bilateral screening mammography at the breast Center for 20 04/19/2015. This showed the breast density to be category B. A possible mass in the right breast with 2 diagnostic right mammography with tomosynthesis and right breast ultrasonography 04/01/2015. There was a small spiculated mass in the central right breast at approximately 11:30 o'clock. There was no palpable abnormality by exam. Ultrasound confirmed an irregularly marginated hypoechoic mass measuring 6 mm in the area in question. Ultrasound of the right axilla was unremarkable.  Biopsy of the mass in question was obtained 04/01/2015 and showed ([id number]) and invasive ductal carcinoma, grade 1, estrogen receptor 75% positive, progesterone receptor 90% positive, both with strong staining intensity, with an MIB-1 of 5%, and no HER-2 amplification, the signals ratio being 1.79 and the number per cell 2.60.  The patient's subsequent history is as detailed below  INTERVAL HISTORY: Deborah Mercado returns today for follow-up of her breast cancer. The interval history is significant for her having started her  radiation treatment she is tolerating these well, with no significant fatigue or disclamation so far. She is here for an 64 initial discussion of antiestrogens  REVIEW OF SYSTEMS: She does get somewhat tired, especially on Friday's. She works every other weekend. She is to take regular walks but she is not doing that at present. Partly this is due to the heat. She has a hard time sometimes going to sleep. Hot flashes bother her both during the day and at night,. Otherwise a detailed review of systems today was stable  PAST MEDICAL HISTORY: Past Medical History  Diagnosis Date  . Vitamin D deficiency   . SUI (stress urinary incontinence, female)   . GERD (gastroesophageal reflux disease)   . Allergy   . Breast cancer 04/01/15    Right breast Invasive ductl carcinoma    PAST SURGICAL HISTORY: Past Surgical History  Procedure Laterality Date  . Breast lumpectomy with radioactive seed and sentinel lymph node biopsy Right 04/19/2015    Procedure: BREAST LUMPECTOMY WITH RADIOACTIVE SEED AND SENTINEL LYMPH NODE MAPPING;  Surgeon: Autumn Messing III, MD;  Location: [facility];  Service: General;  Laterality: Right;    FAMILY HISTORY Family History  Problem Relation Age of Onset  . Heart disease Mother   . Rheum arthritis Mother   . Hypertension Mother   . Rheum arthritis Maternal Grandmother   . Heart attack Maternal Grandmother   . Heart disease Maternal Grandmother    the patient's parents are still living, in their mid 66s. The patient had no brothers, one sister. There is no history of breast or ovarian cancer in the family to her knowledge.  GYNECOLOGIC HISTORY:  Patient's last menstrual period was 11/30/2004. Menarche age 64, first live birth age 27.  The patient is GX P1. She has been on hormone replacement until this diagnosis, with her last dose dose 04/05/2015.  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 She is divorced and lives I herself with 2  [name] and approximately 45 birds. Her daughter [name] lives in [place] where she is supply Electronics engineer for a [employer].    ADVANCED DIRECTIVES: Not in place   HEALTH MAINTENANCE: History  Substance Use Topics  . Smoking status: Never Smoker   . Smokeless tobacco: Never Used  . Alcohol Use: Yes     Comment: occ     Colonoscopy:  PAP:  Bone density:  Lipid panel:  Allergies  Allergen Reactions  . Influenza Vaccines   . Monistat [Miconazole] Other (See Comments)    Severe burning and pain with use    Current Outpatient Prescriptions  Medication Sig Dispense Refill  . DiphenhydrAMINE HCl, Sleep, 25 MG CAPS Take 25 mg by mouth at bedtime. (Patient not taking: Reported on 06/24/2015) 100 capsule 1  . LORazepam (ATIVAN) 0.5 MG tablet Take 0.5 tablets (0.25 mg total) by mouth as directed. As needed for hot flashes. Do not exceed more than three times a week. 30 tablet 0  . oxyCODONE-acetaminophen (ROXICET) 5-325 MG per tablet Take 1-2 tablets by mouth every 4 (four) hours as needed for severe pain. (Patient not taking: Reported on 05/31/2015) 50 tablet 0  . pantoprazole (PROTONIX) 20 MG tablet Take 1 tablet (20 mg total) by mouth daily. 90 tablet 3  . solifenacin (VESICARE) 5 MG tablet Take 1 tablet (5 mg total) by mouth daily. 90 tablet 3  . Vitamin D, Ergocalciferol, (DRISDOL) 50000 UNITS CAPS capsule Take 1 capsule (50,000 Units total) by mouth every 7 (seven) days. 30 capsule 3   No current facility-administered medications for this visit.    OBJECTIVE: Middle-aged white woman in no acute distress Filed Vitals:   07/02/15 0946  BP: 141/83  Pulse: 79  Temp: 98 F (36.7 C)  Resp: 18     Body mass index is 32.99 kg/(m^2).    ECOG FS:0 - Asymptomatic  Sclerae unicteric, EOMs intact Oropharynx clear, dentition in fair repair No cervical or supraclavicular adenopathy Lungs no rales or rhonchi Heart regular rate and rhythm Abd soft, nontender,  positive bowel sounds MSK no focal spinal tenderness, no upper extremity lymphedema Neuro: nonfocal, well oriented, appropriate affect Breasts: The right breast is status post lumpectomy and currently receiving radiation. There is minimal erythema. There is no desquamation. The cosmetic result is good. There is no evidence of local recurrence. The right axilla is benign. Left breast is unremarkable    LAB RESULTS:  CMP     Component Value Date/Time   NA 143 04/10/2015 1225   NA 142 03/18/2015 1454   K 4.2 04/10/2015 1225   K 3.9 03/18/2015 1454   CL 108 03/18/2015 1454   CO2 25 04/10/2015 1225   CO2 24 03/18/2015 1454   GLUCOSE 91 04/10/2015 1225   GLUCOSE 87 03/18/2015 1454   BUN 8.0 04/10/2015 1225   BUN 8 03/18/2015 1454   CREATININE 0.8 04/10/2015 1225   CREATININE 0.76 03/18/2015 1454   CALCIUM 9.0 04/10/2015 1225   CALCIUM 8.7 03/18/2015 1454   PROT 7.4 04/10/2015 1225   PROT 6.8 03/18/2015 1454   ALBUMIN 3.9 04/10/2015 1225   ALBUMIN 4.0 03/18/2015 1454   AST 20 04/10/2015 1225   AST 15 03/18/2015 1454   ALT 11 04/10/2015  1225   ALT 10 03/18/2015 1454   ALKPHOS 84 04/10/2015 1225   ALKPHOS 68 03/18/2015 1454   BILITOT 0.69 04/10/2015 1225   BILITOT 0.8 03/18/2015 1454    INo results found for: SPEP, UPEP  Lab Results  Component Value Date   WBC 6.8 04/10/2015   NEUTROABS 4.3 04/10/2015   HGB 13.9 04/10/2015   HCT 41.8 04/10/2015   MCV 86.2 04/10/2015   PLT 200 04/10/2015      Chemistry      Component Value Date/Time   NA 143 04/10/2015 1225   NA 142 03/18/2015 1454   K 4.2 04/10/2015 1225   K 3.9 03/18/2015 1454   CL 108 03/18/2015 1454   CO2 25 04/10/2015 1225   CO2 24 03/18/2015 1454   BUN 8.0 04/10/2015 1225   BUN 8 03/18/2015 1454   CREATININE 0.8 04/10/2015 1225   CREATININE 0.76 03/18/2015 1454      Component Value Date/Time   CALCIUM 9.0 04/10/2015 1225   CALCIUM 8.7 03/18/2015 1454   ALKPHOS 84 04/10/2015 1225   ALKPHOS 68  03/18/2015 1454   AST 20 04/10/2015 1225   AST 15 03/18/2015 1454   ALT 11 04/10/2015 1225   ALT 10 03/18/2015 1454   BILITOT 0.69 04/10/2015 1225   BILITOT 0.8 03/18/2015 1454       No results found for: LABCA2  No components found for: LABCA125  No results for input(s): INR in the last 168 hours.  Urinalysis    Component Value Date/Time   BILIRUBINUR neg 03/18/2015 1410   PROTEINUR neg 03/18/2015 1410   UROBILINOGEN negative 03/18/2015 1410   NITRITE neg 03/18/2015 1410   LEUKOCYTESUR Negative 03/18/2015 1410    STUDIES: No results found.  ASSESSMENT: 64 y.o. [place], [place] woman status post right breast biopsy 04/01/2015 for a clinical T1b N0, stage IA invasive ductal carcinoma, grade 1, estrogen and progesterone receptor positive, HER-2 not amplified, with an MIB-1 of 5%  (1) status post right lumpectomy and sentinel lymph node sampling 04/19/2015 for a pT1c pN0, stage IA invasive ductal carcinoma, grade 1, with negative margins  (2) Oncotype DX score of 13 predicts a 10 year risk of outside the breast recurrence of 9% if the patient's only systemic therapy is tamoxifen for 5 years. It also predicts no benefit from chemotherapy  (3) radiation therapy will be completed 07/22/2015  (4) antiestrogens therapy to follow radiation  PLAN: Deborah Mercado is tolerating radiation well so far. She will be done later this month. She will probably irradiate 2 start antiestrogen sometime early October.  Today we discussed the difference between tamoxifen and anastrozole. She has a good understanding of the possible toxicities, side effects and complications of these agents. I think she would be a good candidate for 5 years versus 10 years of treatment and that means to maximize benefit she would need to take aromatase inhibitors for at least 2 years.  Accordingly we are going to start with anastrozole. She was started October 1 and she will see me in the Center by which time if she is  going to have significant symptoms they should have developed. If she tolerates it well we will follow-up with a DEXA scan and work to optimize her bone density.  It would be useful to get a better hold on the hot flashes problems, especially the nighttime hot flashes. Accordingly I have put in a prescription for gabapentin for her. She will let us know if that does not work or  if it causes her drowsiness in the morning. I am also starting her on venlafaxine 37.5 mg XRT take daily. If she finds this does not take care of the daytime hot flashes sufficiently well we will double the dose.  Deborah Mercado is a good understanding of this plan. She agrees with it. She will call with any problems that may develop before her next visit here.  Chauncey Cruel, MD   07/02/2015 10:04 AM Medical Oncology and Hematology [facility] [address] [place], [place] [address] Tel. [phone]    Fax. [phone]

## 2015-07-02 NOTE — Telephone Encounter (Signed)
Gave avs & calendar for December. °

## 2015-07-03 ENCOUNTER — Ambulatory Visit
Admission: RE | Admit: 2015-07-03 | Discharge: 2015-07-03 | Disposition: A | Discharge status: Home / Self Care | Payer: 59 | Source: Ambulatory Visit | Attending: Radiation Oncology | Admitting: Radiation Oncology

## 2015-07-03 ENCOUNTER — Ambulatory Visit: Payer: 59 | Admitting: Radiation Oncology

## 2015-07-03 DIAGNOSIS — C50411 Malignant neoplasm of upper-outer quadrant of right female breast: Secondary | ICD-10-CM | POA: Diagnosis not present

## 2015-07-04 ENCOUNTER — Ambulatory Visit
Admission: RE | Admit: 2015-07-04 | Discharge: 2015-07-04 | Disposition: A | Discharge status: Home / Self Care | Payer: 59 | Source: Ambulatory Visit | Attending: Radiation Oncology | Admitting: Radiation Oncology

## 2015-07-04 DIAGNOSIS — C50411 Malignant neoplasm of upper-outer quadrant of right female breast: Secondary | ICD-10-CM | POA: Diagnosis not present

## 2015-07-05 ENCOUNTER — Encounter: Payer: Self-pay | Admitting: Radiation Oncology

## 2015-07-05 ENCOUNTER — Ambulatory Visit
Admission: RE | Admit: 2015-07-05 | Discharge: 2015-07-05 | Disposition: A | Discharge status: Home / Self Care | Payer: 59 | Source: Ambulatory Visit | Attending: Radiation Oncology | Admitting: Radiation Oncology

## 2015-07-05 DIAGNOSIS — C50411 Malignant neoplasm of upper-outer quadrant of right female breast: Secondary | ICD-10-CM | POA: Diagnosis not present

## 2015-07-05 NOTE — Progress Notes (Signed)
Photon Boost Complex Emergency planning/management officer Note  Diagnosis: Breast Cancer  The patient's CT images from her free-breathing simulation were reviewed to plan her boost treatment to her right breast  lumpectomy cavity.  The boost to the lumpectomy cavity will be delivered with 3 photon fields using MLCs for custom blocks again heart and lungs.  This constitutes 3 complex treatment devices. Isodose plan was reviewed and approved. 10 Gy in 5 fractions prescribed.  -----------------------------------  Eppie Gibson, MD

## 2015-07-08 ENCOUNTER — Ambulatory Visit
Admission: RE | Admit: 2015-07-08 | Discharge: 2015-07-08 | Disposition: A | Discharge status: Home / Self Care | Payer: 59 | Source: Ambulatory Visit | Attending: Radiation Oncology | Admitting: Radiation Oncology

## 2015-07-08 ENCOUNTER — Encounter: Payer: Self-pay | Admitting: Radiation Oncology

## 2015-07-08 VITALS — BP 142/71 | HR 71 | Temp 98.2°F | Resp 12 | Wt 192.4 lb

## 2015-07-08 DIAGNOSIS — C50411 Malignant neoplasm of upper-outer quadrant of right female breast: Secondary | ICD-10-CM | POA: Diagnosis not present

## 2015-07-08 NOTE — Progress Notes (Signed)
    Weekly Management Note:  outpatient    ICD-9-CM ICD-10-CM   1. Breast cancer of upper-outer quadrant of right female breast 174.4 C50.411     Current Dose:  40 Gy  Projected Dose: 60 Gy   Narrative:  The patient presents for routine under treatment assessment.  CBCT/MVCT images/Port film x-rays were reviewed.  The chart was checked. Doing well, no new complaints except nipple soreness  Physical Findings:  weight is 192 lb 6.4 oz (87.272 kg). Her oral temperature is 98.2 F (36.8 C). Her blood pressure is 142/71 and her pulse is 71. Her respiration is 12 and oxygen saturation is 100%.   Wt Readings from Last 3 Encounters:  07/08/15 192 lb 6.4 oz (87.272 kg)  07/02/15 192 lb 4.8 oz (87.227 kg)  07/01/15 192 lb 3.2 oz (87.181 kg)   NAD, right breast erythema, skin intact  Impression:  The patient is tolerating radiotherapy.  Plan:  Continue radiotherapy as planned.  Continue radiaplex  ________________________________   Eppie Gibson, M.D.

## 2015-07-08 NOTE — Progress Notes (Signed)
PAIN: She rates her pain as a 6 on a scale of 0-10. constant and burning over right breast nipple. SKIN: Pt right breast- positive for erythema, breast tenderness and dry desquamation.  Pt denies edema.  Pt continues to apply Radiaplex as directed. OTHER: Pt complains of fatigue. BP 142/71 mmHg  Pulse 71  Temp(Src) 98.2 F (36.8 C) (Oral)  Resp 12  Wt 192 lb 6.4 oz (87.272 kg)  SpO2 100%  LMP 11/30/2004 Wt Readings from Last 3 Encounters:  07/08/15 192 lb 6.4 oz (87.272 kg)  07/02/15 192 lb 4.8 oz (87.227 kg)  07/01/15 192 lb 3.2 oz (87.181 kg)

## 2015-07-09 ENCOUNTER — Ambulatory Visit
Admission: RE | Admit: 2015-07-09 | Discharge: 2015-07-09 | Disposition: A | Discharge status: Home / Self Care | Payer: 59 | Source: Ambulatory Visit | Attending: Radiation Oncology | Admitting: Radiation Oncology

## 2015-07-09 DIAGNOSIS — C50411 Malignant neoplasm of upper-outer quadrant of right female breast: Secondary | ICD-10-CM | POA: Diagnosis not present

## 2015-07-10 ENCOUNTER — Ambulatory Visit
Admission: RE | Admit: 2015-07-10 | Discharge: 2015-07-10 | Disposition: A | Discharge status: Home / Self Care | Payer: 59 | Source: Ambulatory Visit | Attending: Radiation Oncology | Admitting: Radiation Oncology

## 2015-07-10 DIAGNOSIS — C50411 Malignant neoplasm of upper-outer quadrant of right female breast: Secondary | ICD-10-CM | POA: Diagnosis not present

## 2015-07-11 ENCOUNTER — Ambulatory Visit
Admission: RE | Admit: 2015-07-11 | Discharge: 2015-07-11 | Disposition: A | Discharge status: Home / Self Care | Payer: 59 | Source: Ambulatory Visit | Attending: Radiation Oncology | Admitting: Radiation Oncology

## 2015-07-11 DIAGNOSIS — C50411 Malignant neoplasm of upper-outer quadrant of right female breast: Secondary | ICD-10-CM | POA: Diagnosis not present

## 2015-07-12 ENCOUNTER — Ambulatory Visit
Admission: RE | Admit: 2015-07-12 | Discharge: 2015-07-12 | Disposition: A | Discharge status: Home / Self Care | Payer: 59 | Source: Ambulatory Visit | Attending: Radiation Oncology | Admitting: Radiation Oncology

## 2015-07-12 DIAGNOSIS — C50411 Malignant neoplasm of upper-outer quadrant of right female breast: Secondary | ICD-10-CM | POA: Diagnosis not present

## 2015-07-15 ENCOUNTER — Ambulatory Visit
Admission: RE | Admit: 2015-07-15 | Discharge: 2015-07-15 | Disposition: A | Discharge status: Home / Self Care | Payer: 59 | Source: Ambulatory Visit | Attending: Radiation Oncology | Admitting: Radiation Oncology

## 2015-07-15 ENCOUNTER — Encounter: Payer: Self-pay | Admitting: Radiation Oncology

## 2015-07-15 VITALS — BP 139/70 | HR 78 | Temp 98.1°F | Ht 64.0 in | Wt 191.5 lb

## 2015-07-15 DIAGNOSIS — C50411 Malignant neoplasm of upper-outer quadrant of right female breast: Secondary | ICD-10-CM | POA: Diagnosis not present

## 2015-07-15 NOTE — Progress Notes (Addendum)
    Weekly Management Note:  Outpatient    ICD-9-CM ICD-10-CM   1. Breast cancer of upper-outer quadrant of right female breast 174.4 C50.411     Current Dose:  50 Gy  Projected Dose: 60 Gy   Narrative:  The patient presents for routine under treatment assessment.  CBCT/MVCT images/Port film x-rays were reviewed.  The chart was checked. Fatigue at end of each week.  Physical Findings:  height is 5\' 4"  (1.626 m) and weight is 191 lb 8 oz (86.864 kg). Her temperature is 98.1 F (36.7 C). Her blood pressure is 139/70 and her pulse is 78.   Wt Readings from Last 3 Encounters:  07/15/15 191 lb 8 oz (86.864 kg)  07/08/15 192 lb 6.4 oz (87.272 kg)  07/02/15 192 lb 4.8 oz (87.227 kg)     NAD, right breast erythema, moist desquamation at axillary scar and dry desquamation at IM fold (right)  Impression:  The patient is tolerating radiotherapy.  Plan:  Continue radiotherapy as planned. Patient instructed to apply Radiplex to intact skin and apply triple antibiotic ointment to desquamated areas in treatment fields.      ________________________________   Eppie Gibson, M.D.

## 2015-07-15 NOTE — Progress Notes (Signed)
Deborah Mercado has received 25 fractions to her right breast. Note moist desquamation in the right axilla with drainage and a small area in the inframmary fold.  She reports fatigue by the end of each treatment week.

## 2015-07-16 ENCOUNTER — Ambulatory Visit
Admission: RE | Admit: 2015-07-16 | Discharge: 2015-07-16 | Disposition: A | Discharge status: Home / Self Care | Payer: 59 | Source: Ambulatory Visit | Attending: Radiation Oncology | Admitting: Radiation Oncology

## 2015-07-16 DIAGNOSIS — C50411 Malignant neoplasm of upper-outer quadrant of right female breast: Secondary | ICD-10-CM | POA: Diagnosis not present

## 2015-07-17 ENCOUNTER — Ambulatory Visit
Admission: RE | Admit: 2015-07-17 | Discharge: 2015-07-17 | Disposition: A | Discharge status: Home / Self Care | Payer: 59 | Source: Ambulatory Visit | Attending: Radiation Oncology | Admitting: Radiation Oncology

## 2015-07-17 DIAGNOSIS — C50411 Malignant neoplasm of upper-outer quadrant of right female breast: Secondary | ICD-10-CM | POA: Diagnosis not present

## 2015-07-18 ENCOUNTER — Ambulatory Visit
Admission: RE | Admit: 2015-07-18 | Discharge: 2015-07-18 | Disposition: A | Discharge status: Home / Self Care | Payer: 59 | Source: Ambulatory Visit | Attending: Radiation Oncology | Admitting: Radiation Oncology

## 2015-07-18 DIAGNOSIS — C50411 Malignant neoplasm of upper-outer quadrant of right female breast: Secondary | ICD-10-CM | POA: Diagnosis not present

## 2015-07-19 ENCOUNTER — Ambulatory Visit
Admission: RE | Admit: 2015-07-19 | Discharge: 2015-07-19 | Disposition: A | Discharge status: Home / Self Care | Payer: 59 | Source: Ambulatory Visit | Attending: Radiation Oncology | Admitting: Radiation Oncology

## 2015-07-19 DIAGNOSIS — C50411 Malignant neoplasm of upper-outer quadrant of right female breast: Secondary | ICD-10-CM | POA: Diagnosis not present

## 2015-07-22 ENCOUNTER — Ambulatory Visit
Admission: RE | Admit: 2015-07-22 | Discharge: 2015-07-22 | Disposition: A | Discharge status: Home / Self Care | Payer: 59 | Source: Ambulatory Visit | Attending: Radiation Oncology | Admitting: Radiation Oncology

## 2015-07-22 ENCOUNTER — Encounter: Payer: Self-pay | Admitting: Radiation Oncology

## 2015-07-22 VITALS — BP 138/80 | HR 73 | Temp 98.4°F | Resp 12 | Wt 192.1 lb

## 2015-07-22 DIAGNOSIS — C50411 Malignant neoplasm of upper-outer quadrant of right female breast: Secondary | ICD-10-CM | POA: Insufficient documentation

## 2015-07-22 MED ORDER — RADIAPLEXRX EX GEL
Freq: Once | CUTANEOUS | Status: AC
  Administered 2015-07-22: 17:00:00 via TOPICAL

## 2015-07-22 NOTE — Addendum Note (Signed)
Encounter addended by: Jenene Slicker, RN on: 07/22/2015  5:19 PM<BR>     Documentation filed: [employer], Inpatient MAR, Orders

## 2015-07-22 NOTE — Progress Notes (Signed)
   Weekly Management Note  outpatient    ICD-9-CM ICD-10-CM   1. Breast cancer of upper-outer quadrant of right female breast 174.4 C50.411     Completed Radiotherapy. Total Dose:  60Gy   Narrative:  The patient presents for routine under treatment assessment on last day of radiotherapy.  CBCT/MVCT images/Port film x-rays were reviewed.  The chart was checked. Moist desquamation in right axilla.  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   Physical Findings:  weight is 192 lb 1.6 oz (87.136 kg). Her oral temperature is 98.4 F (36.9 C). Her blood pressure is 138/80 and her pulse is 73. Her respiration is 12 and oxygen saturation is 97%.    Moist desquamation in right axilla along axillary scar. Erythematous right breast  Impression:  The patient has tolerated radiotherapy.  Plan:  Routine follow-up in one month. Neosporin over axillary scar. Dressings given. F/u 78mo, sooner if needed.  1 week excuse from work - letter given. ________________________________   Eppie Gibson, M.D.

## 2015-07-22 NOTE — Progress Notes (Signed)
PAIN: She is currently in no pain.   SKIN: Pt right breast- positive for Pruritus, erythema, breast tenderness, moist desquamation-yellow white drainage from approx 2 in open area and dry desquamation to right breast fold.  Pt denies edema.  Pt continues to apply Radiaplex and Neosporin as directed. OTHER: Pt complains of fatigue and weakness. Today was her final treatment BP 138/80 mmHg  Pulse 73  Temp(Src) 98.4 F (36.9 C) (Oral)  Resp 12  Wt 192 lb 1.6 oz (87.136 kg)  SpO2 97%  LMP 11/30/2004 Wt Readings from Last 3 Encounters:  07/22/15 192 lb 1.6 oz (87.136 kg)  07/15/15 191 lb 8 oz (86.864 kg)  07/08/15 192 lb 6.4 oz (87.272 kg)

## 2015-07-25 ENCOUNTER — Other Ambulatory Visit: Payer: Self-pay | Admitting: Adult Health

## 2015-07-25 DIAGNOSIS — C50411 Malignant neoplasm of upper-outer quadrant of right female breast: Secondary | ICD-10-CM

## 2015-07-26 ENCOUNTER — Telehealth: Payer: Self-pay

## 2015-07-26 NOTE — Telephone Encounter (Signed)
Patient called stating she feels sick.Has sore throat and thinks possibly bronchitis.Informed patient to call her primary care physician or try urgent care.Patient completed radiation to breast on 07/22/15.

## 2015-08-01 ENCOUNTER — Telehealth: Payer: Self-pay | Admitting: *Deleted

## 2015-08-01 NOTE — Telephone Encounter (Signed)
Spoke with patient to follow up after radiation.  She is doing well except she is suffering from a summer cold and congestion. Encouraged her to call with any needs or concerns.

## 2015-08-05 NOTE — Progress Notes (Signed)
  Radiation Oncology         [phone] ________________________________  Name: ZAKYA HALABI MRN: [id number]  Date: 07/22/2015  DOB: 11/14/1951  End of Treatment Note  DIAGNOSIS:    ICD-9-CM ICD-10-CM   1. Breast cancer of upper-outer quadrant of right female breast 174.4 C50.        Right breast invasive ductal carcinoma Stage I pTIcN0 cM0 ER 75%, PR 90%, and HER2 negative  Indication for treatment:  curative       Radiation treatment dates:   06/11/2015-07/22/2015  Site/dose:   1)Right breast / 50 Gy in 25 fractions 2) Right breast boost / 10 Gy in 5 fractions  Beams/energy:   1) 3D Tangents / 10 and 6 MV  2) 3 field photon boost / 10 and 6 MV photons  Narrative: The patient tolerated radiation treatment relatively well.      Plan: The patient has completed radiation treatment. The patient will return to radiation oncology clinic for routine followup in one month. I advised them to call or return sooner if they have any questions or concerns related to their recovery or treatment.  -----------------------------------  Eppie Gibson, MD

## 2015-08-19 ENCOUNTER — Telehealth: Payer: Self-pay | Admitting: Oncology

## 2015-08-19 NOTE — Telephone Encounter (Signed)
Deborah Mercado called and asked if she can have a TB shot.  Called her back and left a message advising her that she can have a TB shot.

## 2015-08-23 ENCOUNTER — Telehealth: Payer: Self-pay | Admitting: Oncology

## 2015-08-23 ENCOUNTER — Ambulatory Visit
Admission: RE | Admit: 2015-08-23 | Discharge: 2015-08-23 | Disposition: A | Discharge status: Home / Self Care | Payer: 59 | Source: Ambulatory Visit | Attending: Radiation Oncology | Admitting: Radiation Oncology

## 2015-08-23 VITALS — BP 138/80 | HR 70 | Temp 98.3°F | Wt 189.9 lb

## 2015-08-23 DIAGNOSIS — C50411 Malignant neoplasm of upper-outer quadrant of right female breast: Secondary | ICD-10-CM

## 2015-08-23 NOTE — Progress Notes (Signed)
Radiation Oncology         [phone] ________________________________  Name: Deborah Mercado MRN: [id number]  Date: 08/23/2015  DOB: 1951-01-24  Follow-Up Visit Note  Outpatient  CC: FRIED, Jaymes Graff, MD  Magrinat, Virgie Dad, MD  Diagnosis and Prior Radiotherapy:    ICD-9-CM ICD-10-CM   1. Breast cancer of upper-outer quadrant of right female breast 174.4 C50.411    Right breast invasive ductal carcinoma Stage I pTIcN0 cM0 ER 75%, PR 90%, and HER2 negative  Indication for treatment:  curative      Radiation treatment dates:   06/11/2015-07/22/2015 Site/dose:   1)Right breast / 50 Gy in 25 fractions 2) Right breast boost / 10 Gy in 5 fractions Beams/energy:   1) 3D Tangents / 10 and 6 MV  2) 3 field photon boost / 10 and 6 MV photons  Narrative:  The patient returns today for routine follow-up appointment with radiation oncology.  She is still tired. She has started working. Skin healed well. Has not picked up Arimedex yet nor Effexor.                               ALLERGIES:  is allergic to influenza vaccines and monistat.  Meds: Current Outpatient Prescriptions  Medication Sig Dispense Refill  . LORazepam (ATIVAN) 0.5 MG tablet Take 0.5 tablets (0.25 mg total) by mouth as directed. As needed for hot flashes. Do not exceed more than three times a week. 30 tablet 0  . pantoprazole (PROTONIX) 20 MG tablet Take 1 tablet (20 mg total) by mouth daily. 90 tablet 3  . solifenacin (VESICARE) 5 MG tablet Take 1 tablet (5 mg total) by mouth daily. 90 tablet 3  . Vitamin D, Ergocalciferol, (DRISDOL) 50000 UNITS CAPS capsule Take 1 capsule (50,000 Units total) by mouth every 7 (seven) days. 30 capsule 3  . anastrozole (ARIMIDEX) 1 MG tablet Take 1 tablet (1 mg total) by mouth daily. (Patient not taking: Reported on 07/15/2015) 90 tablet 4  . gabapentin (NEURONTIN) 100 MG capsule Take 1 capsule (100 mg total) by mouth at bedtime. (Patient not taking: Reported on 08/23/2015) 90 capsule 4  .  venlafaxine XR (EFFEXOR-XR) 37.5 MG 24 hr capsule Take 1 capsule (37.5 mg total) by mouth daily with breakfast. (Patient not taking: Reported on 08/23/2015) 90 capsule 4   No current facility-administered medications for this encounter.    Physical Findings: The patient is in no acute distress. Patient is alert and oriented.    weight is 189 lb 14.4 oz (86.138 kg). Her temperature is 98.3 F (36.8 C). Her blood pressure is 138/80 and her pulse is 70. Her oxygen saturation is 100%.  Right breast skin intact with minimal residual hyperpigmentation   Lab Findings: Lab Results  Component Value Date   WBC 6.8 04/10/2015   HGB 13.9 04/10/2015   HCT 41.8 04/10/2015   MCV 86.2 04/10/2015   PLT 200 04/10/2015    Radiographic Findings: No results found.  Impression/Plan:   Deborah Mercado is a 64 year old female presenting to [facility] in regards to her cancer of the right female breast. She is recovering well from the effects of radiation therapy treatments and surgery. Healthy methods of management to address vocalized symptoms were addressed and reviewed. She understands the importance of receiving annual mammograms. The patient understands that she can access her appointments and medical records via [facility]. All vocalized questions and concerns have been addressed.  If the patient develops any further questions or concerns in regards to her treatment and recovery, she has been encouraged to contact Dr. Isidore Moos, MD. I will see her back PRN.  Encouraged to start Arimidex on Oct 1st per Dr Magrinat's notes and to attend followup in his clinic as scheduled. Also encouraged to start regular exercise for QOL.   This document serves as a record of services personally performed by Eppie Gibson, MD. It was created on her behalf by Lenn Cal, a trained medical scribe. The creation of this record is based on the scribe's personal observations and the provider's statements to them. This document has  been checked and approved by the attending provider.  _____________________________________   Eppie Gibson, MD

## 2015-08-23 NOTE — Progress Notes (Signed)
Patient for one month follow up completion of radiation to right breast from 06/11/15-07/22/15.Has shooting pain through breast which is normal.Skin has some patches of discoloration.Continue to moisture twice daily.Doesnot want to be seen by Survivorship, states she cancelled appointment .Patient hasn't picked up effexor or arimidex from pharmacy, states she wasn't aware of prescriptions.Told to pick up soon as effexor would help with hot flashes and to start arimidex on October 1,2016 per Dr.Magrinats note. BP 138/80 mmHg  Pulse 70  Temp(Src) 98.3 F (36.8 C)  Wt 189 lb 14.4 oz (86.138 kg)  SpO2 100%  LMP 11/30/2004

## 2015-08-23 NOTE — Telephone Encounter (Signed)
Patient does not want appointment for Survivorship in October.

## 2015-09-19 ENCOUNTER — Encounter: Payer: 59 | Admitting: Nurse Practitioner

## 2015-11-06 ENCOUNTER — Other Ambulatory Visit: Payer: Self-pay | Admitting: *Deleted

## 2015-11-06 DIAGNOSIS — C50411 Malignant neoplasm of upper-outer quadrant of right female breast: Secondary | ICD-10-CM

## 2015-11-07 ENCOUNTER — Other Ambulatory Visit (HOSPITAL_BASED_OUTPATIENT_CLINIC_OR_DEPARTMENT_OTHER): Payer: 59

## 2015-11-07 DIAGNOSIS — C50411 Malignant neoplasm of upper-outer quadrant of right female breast: Secondary | ICD-10-CM

## 2015-11-07 LAB — COMPREHENSIVE METABOLIC PANEL
ALT: 14 U/L (ref 0–55)
AST: 19 U/L (ref 5–34)
Albumin: 3.9 g/dL (ref 3.5–5.0)
Alkaline Phosphatase: 110 U/L (ref 40–150)
Anion Gap: 10 mEq/L (ref 3–11)
BUN: 10.6 mg/dL (ref 7.0–26.0)
CHLORIDE: 107 meq/L (ref 98–109)
CO2: 25 mEq/L (ref 22–29)
Calcium: 9.5 mg/dL (ref 8.4–10.4)
Creatinine: 0.8 mg/dL (ref 0.6–1.1)
EGFR: 74 mL/min/{1.73_m2} — ABNORMAL LOW (ref >90)
GLUCOSE: 92 mg/dL (ref 70–140)
Potassium: 3.9 mEq/L (ref 3.5–5.1)
SODIUM: 142 meq/L (ref 136–145)
Total Bilirubin: 0.83 mg/dL (ref 0.20–1.20)
Total Protein: 7.7 g/dL (ref 6.4–8.3)

## 2015-11-07 LAB — CBC WITH DIFFERENTIAL/PLATELET
BASO%: 0.7 % (ref 0.0–2.0)
Basophils Absolute: 0 10*3/uL (ref 0.0–0.1)
EOS%: 2.1 % (ref 0.0–7.0)
Eosinophils Absolute: 0.1 10*3/uL (ref 0.0–0.5)
HCT: 40.7 % (ref 34.8–46.6)
HGB: 13.6 g/dL (ref 11.6–15.9)
LYMPH%: 27.7 % (ref 14.0–49.7)
MCH: 28.8 pg (ref 25.1–34.0)
MCHC: 33.3 g/dL (ref 31.5–36.0)
MCV: 86.4 fL (ref 79.5–101.0)
MONO#: 0.4 10*3/uL (ref 0.1–0.9)
MONO%: 6.9 % (ref 0.0–14.0)
NEUT%: 62.6 % (ref 38.4–76.8)
NEUTROS ABS: 3.7 10*3/uL (ref 1.5–6.5)
Platelets: 179 10*3/uL (ref 145–400)
RBC: 4.71 10*6/uL (ref 3.70–5.45)
RDW: 13.6 % (ref 11.2–14.5)
WBC: 5.9 10*3/uL (ref 3.9–10.3)
lymph#: 1.6 10*3/uL (ref 0.9–3.3)

## 2015-11-11 ENCOUNTER — Telehealth: Payer: Self-pay | Admitting: Oncology

## 2015-11-11 ENCOUNTER — Ambulatory Visit (HOSPITAL_BASED_OUTPATIENT_CLINIC_OR_DEPARTMENT_OTHER): Payer: 59 | Admitting: Oncology

## 2015-11-11 VITALS — BP 146/75 | HR 72 | Temp 98.2°F | Resp 18 | Ht 64.0 in | Wt 191.2 lb

## 2015-11-11 DIAGNOSIS — Z170 Estrogen receptor positive status [ER+]: Secondary | ICD-10-CM

## 2015-11-11 DIAGNOSIS — R5383 Other fatigue: Secondary | ICD-10-CM | POA: Diagnosis not present

## 2015-11-11 DIAGNOSIS — C50911 Malignant neoplasm of unspecified site of right female breast: Secondary | ICD-10-CM

## 2015-11-11 DIAGNOSIS — C50411 Malignant neoplasm of upper-outer quadrant of right female breast: Secondary | ICD-10-CM

## 2015-11-11 MED ORDER — LORAZEPAM 0.5 MG PO TABS: 0.2500 mg | ORAL_TABLET | ORAL | Status: DC

## 2015-11-11 MED ORDER — ANASTROZOLE 1 MG PO TABS: 1.0000 mg | ORAL_TABLET | Freq: Every day | ORAL | Status: DC

## 2015-11-11 MED ORDER — VENLAFAXINE HCL ER 37.5 MG PO CP24: 37.5000 mg | ORAL_CAPSULE | Freq: Every day | ORAL | Status: DC

## 2015-11-11 NOTE — Progress Notes (Signed)
[facility]  [phone] [phone]     ID: Deborah Mercado DOB: 04-Jul-1951  MR#: [id number]  GOT#:[id number]  Patient Care Team: Briscoe Deutscher, MD as PCP - General (Family Medicine) Kem Boroughs, FNP as Nurse Practitioner (Nurse Practitioner) Autumn Messing III, MD as Consulting Physician (General Surgery) Chauncey Cruel, MD as Consulting Physician (Oncology) Eppie Gibson, MD as Attending Physician (Radiation Oncology) Rockwell Germany, RN as Registered Nurse Mauro Kaufmann, RN as Registered Nurse Holley Bouche, NP as Nurse Practitioner (Nurse Practitioner) PCP: Abigail Miyamoto, MD GYN: Roe Coombs, NP OTHER MD:  CHIEF COMPLAINT: Estrogen receptor positive breast cancer  CURRENT TREATMENT:  Anastrozole   BREAST CANCER HISTORY: From the original intake note:  Deborah Mercado had routine bilateral screening mammography at the breast Center for 20 04/19/2015. This showed the breast density to be category B. A possible mass in the right breast with 2 diagnostic right mammography with tomosynthesis and right breast ultrasonography 04/01/2015. There was a small spiculated mass in the central right breast at approximately 11:30 o'clock. There was no palpable abnormality by exam. Ultrasound confirmed an irregularly marginated hypoechoic mass measuring 6 mm in the area in question. Ultrasound of the right axilla was unremarkable.  Biopsy of the mass in question was obtained 04/01/2015 and showed ([id number]) and invasive ductal carcinoma, grade 1, estrogen receptor 75% positive, progesterone receptor 90% positive, both with strong staining intensity, with an MIB-1 of 5%, and no HER-2 amplification, the signals ratio being 1.79 and the number per cell 2.60.  The patient's subsequent history is as detailed below  INTERVAL HISTORY: Deborah Mercado returns today for follow-up of her estrogen receptor positive breast cancer. Since the last visit here she completed  her radiation treatments and then on 08/31/2015 started anastrozole. She obtains it at a very good price, less than $5 a month. She is having some hot flashes, both during the day, which can surprise her, and then at night. She tried the gabapentin 300 mg and I was too strong. We dropped at 200 mg but she never filled it because she is concerned it would also make her sleepy. She is taking venlafaxine at 37.5 mg daily and that is helping. She does not like the idea of increasing the dose of that medication. She finds that lorazepam is helpful. She takes it perhaps once a week.   REVIEW OF SYSTEMS: She exercises by walking her dogs, 10-15 minutes daily. She does not belong to a gym. She is very tired after work and still works every other weekend. She did well with the radiation, with some erythema but no significant desquamation. She is still more tired than usual. She had more problems with the axillary wound, which fell right on the line of the broad. That cause significant discomfort. Otherwise a detailed review of systems today is stable  PAST MEDICAL HISTORY: Past Medical History  Diagnosis Date  . Vitamin D deficiency   . SUI (stress urinary incontinence, female)   . GERD (gastroesophageal reflux disease)   . Allergy   . Breast cancer 04/01/15    Right breast Invasive ductl carcinoma    PAST SURGICAL HISTORY: Past Surgical History  Procedure Laterality Date  . Breast lumpectomy with radioactive seed and sentinel lymph node biopsy Right 04/19/2015    Procedure: BREAST LUMPECTOMY WITH RADIOACTIVE SEED AND SENTINEL LYMPH NODE MAPPING;  Surgeon: Autumn Messing III, MD;  Location: [facility];  Service: General;  Laterality: Right;  FAMILY HISTORY Family History  Problem Relation Age of Onset  . Heart disease Mother   . Rheum arthritis Mother   . Hypertension Mother   . Rheum arthritis Maternal Grandmother   . Heart attack Maternal Grandmother   . Heart disease Maternal  Grandmother    the patient's parents are still living, in their mid 63s. The patient had no brothers, one sister. There is no history of breast or ovarian cancer in the family to her knowledge.  GYNECOLOGIC HISTORY:  Patient's last menstrual period was 11/30/2004. Menarche age 8, first live birth age 26. The patient is GX P1. She has been on hormone replacement until this diagnosis, with her last dose dose 04/05/2015.  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 She is divorced and lives I herself with 2 [name] and approximately 59 birds. Her daughter Deborah Mercado lives in [place] where she is supply Electronics engineer for a [employer].    ADVANCED DIRECTIVES: Not in place   HEALTH MAINTENANCE: Social History  Substance Use Topics  . Smoking status: Never Smoker   . Smokeless tobacco: Never Used  . Alcohol Use: Yes     Comment: occ     Colonoscopy:  PAP:  Bone density:  Lipid panel:  Allergies  Allergen Reactions  . Influenza Vaccines   . Monistat [Miconazole] Other (See Comments)    Severe burning and pain with use    Current Outpatient Prescriptions  Medication Sig Dispense Refill  . anastrozole (ARIMIDEX) 1 MG tablet Take 1 tablet (1 mg total) by mouth daily. (Patient not taking: Reported on 07/15/2015) 90 tablet 4  . gabapentin (NEURONTIN) 100 MG capsule Take 1 capsule (100 mg total) by mouth at bedtime. (Patient not taking: Reported on 08/23/2015) 90 capsule 4  . LORazepam (ATIVAN) 0.5 MG tablet Take 0.5 tablets (0.25 mg total) by mouth as directed. As needed for hot flashes. Do not exceed more than three times a week. 30 tablet 0  . pantoprazole (PROTONIX) 20 MG tablet Take 1 tablet (20 mg total) by mouth daily. 90 tablet 3  . solifenacin (VESICARE) 5 MG tablet Take 1 tablet (5 mg total) by mouth daily. 90 tablet 3  . venlafaxine XR (EFFEXOR-XR) 37.5 MG 24 hr capsule Take 1 capsule (37.5 mg total) by mouth daily with breakfast. (Patient  not taking: Reported on 08/23/2015) 90 capsule 4  . Vitamin D, Ergocalciferol, (DRISDOL) 50000 UNITS CAPS capsule Take 1 capsule (50,000 Units total) by mouth every 7 (seven) days. 30 capsule 3   No current facility-administered medications for this visit.    OBJECTIVE: Middle-aged white woman who appears well Filed Vitals:   11/11/15 1035  BP: 146/75  Pulse: 72  Temp: 98.2 F (36.8 C)  Resp: 18     Body mass index is 32.8 kg/(m^2).    ECOG FS:1 - Symptomatic but completely ambulatory  Sclerae unicteric, pupils round and equal Oropharynx clear and moist-- no thrush or other lesions No cervical or supraclavicular adenopathy Lungs no rales or rhonchi Heart regular rate and rhythm Abd soft, nontender, positive bowel sounds MSK no focal spinal tenderness, no upper extremity lymphedema Neuro: nonfocal, well oriented, appropriate affect Breasts: The right breast is status post lumpectomy and radiation. There is minimal residual hyperpigmentation. The cosmetic result is excellent. The right axilla is benign. The left breast is unremarkable.  LAB RESULTS:  CMP     Component Value Date/Time   NA 142 11/07/2015 0934   NA 142  03/18/2015 1454   K 3.9 11/07/2015 0934   K 3.9 03/18/2015 1454   CL 108 03/18/2015 1454   CO2 25 11/07/2015 0934   CO2 24 03/18/2015 1454   GLUCOSE 92 11/07/2015 0934   GLUCOSE 87 03/18/2015 1454   BUN 10.6 11/07/2015 0934   BUN 8 03/18/2015 1454   CREATININE 0.8 11/07/2015 0934   CREATININE 0.76 03/18/2015 1454   CALCIUM 9.5 11/07/2015 0934   CALCIUM 8.7 03/18/2015 1454   PROT 7.7 11/07/2015 0934   PROT 6.8 03/18/2015 1454   ALBUMIN 3.9 11/07/2015 0934   ALBUMIN 4.0 03/18/2015 1454   AST 19 11/07/2015 0934   AST 15 03/18/2015 1454   ALT 14 11/07/2015 0934   ALT 10 03/18/2015 1454   ALKPHOS 110 11/07/2015 0934   ALKPHOS 68 03/18/2015 1454   BILITOT 0.83 11/07/2015 0934   BILITOT 0.8 03/18/2015 1454    INo results found for: SPEP, UPEP  Lab  Results  Component Value Date   WBC 5.9 11/07/2015   NEUTROABS 3.7 11/07/2015   HGB 13.6 11/07/2015   HCT 40.7 11/07/2015   MCV 86.4 11/07/2015   PLT 179 11/07/2015      Chemistry      Component Value Date/Time   NA 142 11/07/2015 0934   NA 142 03/18/2015 1454   K 3.9 11/07/2015 0934   K 3.9 03/18/2015 1454   CL 108 03/18/2015 1454   CO2 25 11/07/2015 0934   CO2 24 03/18/2015 1454   BUN 10.6 11/07/2015 0934   BUN 8 03/18/2015 1454   CREATININE 0.8 11/07/2015 0934   CREATININE 0.76 03/18/2015 1454      Component Value Date/Time   CALCIUM 9.5 11/07/2015 0934   CALCIUM 8.7 03/18/2015 1454   ALKPHOS 110 11/07/2015 0934   ALKPHOS 68 03/18/2015 1454   AST 19 11/07/2015 0934   AST 15 03/18/2015 1454   ALT 14 11/07/2015 0934   ALT 10 03/18/2015 1454   BILITOT 0.83 11/07/2015 0934   BILITOT 0.8 03/18/2015 1454       No results found for: LABCA2  No components found for: LABCA125  No results for input(s): INR in the last 168 hours.  Urinalysis    Component Value Date/Time   BILIRUBINUR neg 03/18/2015 1410   PROTEINUR neg 03/18/2015 1410   UROBILINOGEN negative 03/18/2015 1410   NITRITE neg 03/18/2015 1410   LEUKOCYTESUR Negative 03/18/2015 1410    STUDIES: No results found.  ASSESSMENT: 64 y.o. [place], [place] woman status post right breast biopsy 04/01/2015 for a clinical T1b N0, stage IA invasive ductal carcinoma, grade 1, estrogen and progesterone receptor positive, HER-2 not amplified, with an MIB-1 of 5%  (1) status post right lumpectomy and sentinel lymph node sampling 04/19/2015 for a pT1c pN0, stage IA invasive ductal carcinoma, grade 1, with negative margins  (2) Oncotype DX score of 13 predicts a 10 year risk of outside the breast recurrence of 9% if the patient's only systemic therapy is tamoxifen for 5 years. It also predicts no benefit from chemotherapy  (3) adjuvant radiation 06/11/2015-07/22/2015: 1)Right breast / 50 Gy in 25 fractions 2) Right  breast boost / 10 Gy in 5 fractions  (4) started anastrozole 08/31/2015  PLAN: Deborah Mercado did generally well with the radiation. She still has a little bit of discomfort in the right axilla. She still a little bit more tired than her baseline.  As far as the fatigue goes, I strongly encouraged her to start an exercise program. She tells me  she has a gym a few doors down from her office. She could do 15 minutes on the elliptical there daily, and then walk her dog's in the morning and afternoon, and that would be 45 minutes daily which would be terrific for her.  She is tolerating the anastrozole well, with some hot flashes but no significant vaginal dryness and so far no arthralgias or myalgias. If she wants to try taking 2 of the venlafaxine tablets to bring the dose up to 75 mg, that would be fine. If she finds it makes a significant difference I will be glad to up the dose for her. The plan is to continue anastrozole for 5 years.  I am setting her up for a bone density before her next visit with me, which will be in June. I am also obtaining a vitamin D level before that visit.  She has a good understanding of the overall plan. She agrees with it. She knows to call for any problems that may develop before her next visit here.  Chauncey Cruel, MD   11/11/2015 10:37 AM Medical Oncology and Hematology [facility] [address] [place], [place] [address] Tel. [phone]    Fax. [phone]

## 2015-11-11 NOTE — Telephone Encounter (Signed)
Appointments made and avs printed for pateint °

## 2016-01-22 ENCOUNTER — Other Ambulatory Visit: Payer: Self-pay | Admitting: Oncology

## 2016-01-22 MED FILL — PANTOPRAZOLE SOD DR 20 MG T: 20 | 90 days supply | Qty: 90 | Fill #2

## 2016-01-22 MED FILL — GABAPENTIN 100 MG CAPSULE: 100 | 90 days supply | Qty: 90 | Fill #0

## 2016-01-23 MED FILL — LORazepam 0.5 MG TABS: 0.5 | 90 days supply | Qty: 18 | Fill #0

## 2016-01-27 NOTE — Progress Notes (Signed)
The Survivorship Care Plan was mailed to Ms. Cackowski as she reported not being able to come in to the Survivorship Clinic for an in-person visit at this time. A letter was mailed to her outlining the purpose of the content of the care plan, as well as encouraging her to reach out to me with any questions or concerns.  My business card was included in the correspondence to the patient as well.  A copy of the care plan was also routed/faxed/mailed to FRIED, Jaymes Graff, MD, the patient's PCP.  I will not be placing any follow-up appointments to the Survivorship Clinic for Ms. Narain, but I am happy to see her at any time in the future for any survivorship concerns that may arise. Thank you for allowing me to participate in her care!  Kenn File, [place] [phone]

## 2016-02-27 MED FILL — VENLAFAXINE HCL ER 37.5 MG: 37.5 | 90 days supply | Qty: 90 | Fill #1

## 2016-02-27 MED FILL — ANASTROZOLE 1 MG TABLET: 1 | 90 days supply | Qty: 90 | Fill #1

## 2016-02-27 MED FILL — VIT D2 1.25 MG (50,000 UNIT: 1.25 MG | 90 days supply | Qty: 12 | Fill #2

## 2016-04-06 ENCOUNTER — Ambulatory Visit
Admission: RE | Admit: 2016-04-06 | Discharge: 2016-04-06 | Disposition: A | Discharge status: Home / Self Care | Payer: 59 | Source: Ambulatory Visit | Attending: Oncology | Admitting: Oncology

## 2016-04-06 ENCOUNTER — Ambulatory Visit (INDEPENDENT_AMBULATORY_CARE_PROVIDER_SITE_OTHER): Payer: 59 | Admitting: Nurse Practitioner

## 2016-04-06 ENCOUNTER — Encounter: Payer: Self-pay | Admitting: Nurse Practitioner

## 2016-04-06 VITALS — BP 116/78 | HR 64 | Ht 64.0 in | Wt 190.0 lb

## 2016-04-06 DIAGNOSIS — Z0000 Encounter for general adult medical examination without abnormal findings: Secondary | ICD-10-CM | POA: Diagnosis not present

## 2016-04-06 DIAGNOSIS — Z1211 Encounter for screening for malignant neoplasm of colon: Secondary | ICD-10-CM

## 2016-04-06 DIAGNOSIS — Z01419 Encounter for gynecological examination (general) (routine) without abnormal findings: Secondary | ICD-10-CM | POA: Diagnosis not present

## 2016-04-06 DIAGNOSIS — Z780 Asymptomatic menopausal state: Secondary | ICD-10-CM | POA: Diagnosis not present

## 2016-04-06 DIAGNOSIS — Z1151 Encounter for screening for human papillomavirus (HPV): Secondary | ICD-10-CM | POA: Diagnosis not present

## 2016-04-06 DIAGNOSIS — C50411 Malignant neoplasm of upper-outer quadrant of right female breast: Secondary | ICD-10-CM

## 2016-04-06 DIAGNOSIS — M85851 Other specified disorders of bone density and structure, right thigh: Secondary | ICD-10-CM | POA: Diagnosis not present

## 2016-04-06 DIAGNOSIS — N63 Unspecified lump in breast: Secondary | ICD-10-CM | POA: Diagnosis not present

## 2016-04-06 DIAGNOSIS — E559 Vitamin D deficiency, unspecified: Secondary | ICD-10-CM

## 2016-04-06 LAB — POCT URINALYSIS DIPSTICK
Bilirubin, UA: NEGATIVE
GLUCOSE UA: NEGATIVE
Ketones, UA: NEGATIVE
Leukocytes, UA: NEGATIVE
NITRITE UA: NEGATIVE
Protein, UA: NEGATIVE
RBC UA: NEGATIVE
UROBILINOGEN UA: NEGATIVE
pH, UA: 6

## 2016-04-06 LAB — LIPID PANEL
CHOLESTEROL: 206 mg/dL — AB (ref 125–200)
HDL: 62 mg/dL (ref >=46)
LDL Cholesterol: 133 mg/dL — ABNORMAL HIGH (ref <130)
Total CHOL/HDL Ratio: 3.3 Ratio (ref <=5.0)
Triglycerides: 54 mg/dL (ref <150)
VLDL: 11 mg/dL (ref <30)

## 2016-04-06 LAB — TSH: TSH: 1.39 mIU/L

## 2016-04-06 LAB — HIV ANTIBODY (ROUTINE TESTING W REFLEX): HIV 1&2 Ab, 4th Generation: NONREACTIVE

## 2016-04-06 MED ORDER — VITAMIN D (ERGOCALCIFEROL) 1.25 MG (50000 UNIT) PO CAPS: 50000.0000 [IU] | ORAL_CAPSULE | ORAL | Status: DC

## 2016-04-06 MED ORDER — SOLIFENACIN SUCCINATE 5 MG PO TABS: 5.0000 mg | ORAL_TABLET | Freq: Every day | ORAL | Status: DC

## 2016-04-06 MED ORDER — PANTOPRAZOLE SODIUM 20 MG PO TBEC: 20.0000 mg | DELAYED_RELEASE_TABLET | Freq: Every day | ORAL | Status: DC

## 2016-04-06 NOTE — Progress Notes (Signed)
Patient ID: Deborah Mercado, female   DOB: 1951/08/25, 65 y.o.   MRN: [id number]  65 y.o. G1P1001 Legally Separated Caucasian Fe here for annual exam.   Now on Neurontin for hot flashes.  Still some insomnia from vaso symptoms.  Has done well with right breast cancer treated with lumpectomy 04/19/15 and radiation completed  07/22/15.  She is on Arimidex and plan is for 5 yrs.   Patient's last menstrual period was 11/30/2004 (approximate).          Sexually active: No.  The current method of family planning is none.    Exercising: No.  The patient does not participate in regular exercise at present. Smoker:  no  Health Maintenance: Pap: 12/02/12, Negative with neg HR HPV MMG: 04/06/16, Bi-Rads 1: Negative, Bilateral Diagnostic in one year Colonoscopy:  none BMD: 04/06/16 T Score: -0.9 Spine / -1.1 Right Femur Neck TDaP:  2009 Shingles: Never  Hep C and HIV: done today Labs: PCP and [place], we follow Vitamin D  Urine: negative   reports that she has never smoked. She has never used smokeless tobacco. She reports that she drinks alcohol. She reports that she does not use illicit drugs.  Past Medical History  Diagnosis Date  . Vitamin D deficiency   . SUI (stress urinary incontinence, female)   . GERD (gastroesophageal reflux disease)   . Allergy   . Breast cancer ([place]) 04/01/15    Right breast Invasive ductl carcinoma    Past Surgical History  Procedure Laterality Date  . Breast lumpectomy with radioactive seed and sentinel lymph node biopsy Right 04/19/2015    Procedure: BREAST LUMPECTOMY WITH RADIOACTIVE SEED AND SENTINEL LYMPH NODE MAPPING;  Surgeon: Autumn Messing III, MD;  Location: [facility];  Service: General;  Laterality: Right;    Current Outpatient Prescriptions  Medication Sig Dispense Refill  . anastrozole (ARIMIDEX) 1 MG tablet Take 1 tablet (1 mg total) by mouth daily. 90 tablet 4  . gabapentin (NEURONTIN) 100 MG capsule Take 3 capsules by mouth  daily.  4  . pantoprazole (PROTONIX) 20 MG tablet Take 1 tablet (20 mg total) by mouth daily. 90 tablet 3  . solifenacin (VESICARE) 5 MG tablet Take 1 tablet (5 mg total) by mouth daily. 90 tablet 3  . venlafaxine XR (EFFEXOR-XR) 37.5 MG 24 hr capsule Take 1 capsule (37.5 mg total) by mouth daily with breakfast. 90 capsule 4  . Vitamin D, Ergocalciferol, (DRISDOL) 50000 units CAPS capsule Take 1 capsule (50,000 Units total) by mouth every 7 (seven) days. 30 capsule 3   No current facility-administered medications for this visit.    Family History  Problem Relation Age of Onset  . Heart disease Mother   . Rheum arthritis Mother   . Hypertension Mother   . Rheum arthritis Maternal Grandmother   . Heart attack Maternal Grandmother   . Heart disease Maternal Grandmother     ROS:  Pertinent items are noted in HPI.  Otherwise, a comprehensive ROS was negative.  Exam:   BP 116/78 mmHg  Pulse 64  Ht 5' 4"  (1.626 m)  Wt 190 lb (86.183 kg)  BMI 32.60 kg/m2  LMP 11/30/2004 (Approximate) Height: 5' 4"  (162.6 cm) Ht Readings from Last 3 Encounters:  04/06/16 5' 4"  (1.626 m)  11/11/15 5' 4"  (1.626 m)  07/15/15 5' 4"  (1.626 m)    General appearance: alert, cooperative and appears stated age Head: Normocephalic, without obvious abnormality, atraumatic Neck: no adenopathy, supple, symmetrical, trachea  midline and thyroid normal to inspection and palpation Lungs: clear to auscultation bilaterally Breasts: normal appearance, no masses or tenderness, on the left.  There are surgical and radiation changes on the right. Heart: regular rate and rhythm Abdomen: soft, non-tender; no masses,  no organomegaly Extremities: extremities normal, atraumatic, no cyanosis or edema Skin: Skin color, texture, turgor normal. No rashes or lesions Lymph nodes: Cervical, supraclavicular, and axillary nodes normal. No abnormal inguinal nodes palpated Neurologic: Grossly normal   Pelvic: External genitalia:   no lesions              Urethra:  normal appearing urethra with no masses, tenderness or lesions              Bartholin's and Skene's: normal                 Vagina: normal appearing vagina with normal color and discharge, no lesions              Cervix: anteverted              Pap taken: Yes.   Bimanual Exam:  Uterus:  normal size, contour, position, consistency, mobility, non-tender              Adnexa: no mass, fullness, tenderness               Rectovaginal: Confirms               Anus:  normal sphincter tone, no lesions  Chaperone present: yes  A:  Well Woman with normal exam  Postmenopausal on HRT until 4/16  S/P right breast cancer with lumpectomy 04/19/15 treated with radiation SUI doing well on Vesicare History of Vit D deficiency History of GERD   P:   Reviewed health and wellness pertinent to exam  Pap smear as above  Mammogram was done today and diagnostic for 1 year  Follow with labs   IFOB is given  Refill Vesicare for a year  Refill Protonix for a year  Refill Vit D and follow with labs - recently not as compliant with dosing  She will check on BRCA testing  Counseled on breast self exam, mammography screening, adequate intake of calcium and vitamin D, diet and exercise return annually or prn  An After Visit Summary was printed and given to the patient.

## 2016-04-06 NOTE — Patient Instructions (Addendum)

## 2016-04-07 LAB — VITAMIN D 25 HYDROXY (VIT D DEFICIENCY, FRACTURES): Vit D, 25-Hydroxy: 33 ng/mL (ref 30–100)

## 2016-04-07 LAB — HEPATITIS C ANTIBODY: HCV AB: NEGATIVE

## 2016-04-08 LAB — IPS PAP TEST WITH HPV

## 2016-04-12 NOTE — Progress Notes (Signed)
Encounter reviewed by Dr. Dquan Cortopassi Amundson C. Silva.  

## 2016-04-14 ENCOUNTER — Telehealth: Payer: Self-pay | Admitting: Emergency Medicine

## 2016-04-14 NOTE — Telephone Encounter (Signed)
-----   Message from Kem Boroughs, [place] sent at 04/08/2016  4:58 PM EDT ----- [id number]

## 2016-04-14 NOTE — Telephone Encounter (Signed)
Call to patient and she is given results from Deborah Boroughs, FNP regarding pap smear and blood work. She will continue Vitamin D prescription and work on diet and exercise at home.  Will follow up as needed and as scheduled.  Routing to provider for final review. Patient agreeable to disposition. Will close encounter.

## 2016-04-14 NOTE — Telephone Encounter (Signed)
-----   Message from Kem Boroughs, [place] sent at 04/07/2016  8:29 AM EDT ----- Please let pt know that Hep C and HIV is negative as expected.  The Vit D is better this year at 67 compared to last year at 25.  She may continue RX Vit d at this time. TSH is normal.  The lipid panel shows an elevated total cholesterol from last year from 166 up to 206.  The LDL went from 105 up to 133.  She needs to get back on her low cholesterol diet.

## 2016-05-04 ENCOUNTER — Other Ambulatory Visit (HOSPITAL_BASED_OUTPATIENT_CLINIC_OR_DEPARTMENT_OTHER): Payer: 59

## 2016-05-04 DIAGNOSIS — C50411 Malignant neoplasm of upper-outer quadrant of right female breast: Secondary | ICD-10-CM | POA: Diagnosis not present

## 2016-05-04 LAB — COMPREHENSIVE METABOLIC PANEL
ALBUMIN: 3.8 g/dL (ref 3.5–5.0)
ALK PHOS: 94 U/L (ref 40–150)
ALT: 12 U/L (ref 0–55)
ANION GAP: 8 meq/L (ref 3–11)
AST: 17 U/L (ref 5–34)
BILIRUBIN TOTAL: 0.66 mg/dL (ref 0.20–1.20)
BUN: 10 mg/dL (ref 7.0–26.0)
CALCIUM: 9.5 mg/dL (ref 8.4–10.4)
CO2: 24 meq/L (ref 22–29)
CREATININE: 0.9 mg/dL (ref 0.6–1.1)
Chloride: 109 mEq/L (ref 98–109)
EGFR: 73 mL/min/{1.73_m2} — AB (ref >90)
Glucose: 94 mg/dl (ref 70–140)
Potassium: 4 mEq/L (ref 3.5–5.1)
Sodium: 142 mEq/L (ref 136–145)
TOTAL PROTEIN: 7.8 g/dL (ref 6.4–8.3)

## 2016-05-04 LAB — CBC WITH DIFFERENTIAL/PLATELET
BASO%: 0.7 % (ref 0.0–2.0)
Basophils Absolute: 0 10*3/uL (ref 0.0–0.1)
EOS ABS: 0.1 10*3/uL (ref 0.0–0.5)
EOS%: 2.5 % (ref 0.0–7.0)
HEMATOCRIT: 40.8 % (ref 34.8–46.6)
HGB: 13.4 g/dL (ref 11.6–15.9)
LYMPH#: 1.5 10*3/uL (ref 0.9–3.3)
LYMPH%: 26 % (ref 14.0–49.7)
MCH: 28.1 pg (ref 25.1–34.0)
MCHC: 33 g/dL (ref 31.5–36.0)
MCV: 85.3 fL (ref 79.5–101.0)
MONO#: 0.4 10*3/uL (ref 0.1–0.9)
MONO%: 6.2 % (ref 0.0–14.0)
NEUT%: 64.6 % (ref 38.4–76.8)
NEUTROS ABS: 3.8 10*3/uL (ref 1.5–6.5)
Platelets: 191 10*3/uL (ref 145–400)
RBC: 4.78 10*6/uL (ref 3.70–5.45)
RDW: 13.9 % (ref 11.2–14.5)
WBC: 5.9 10*3/uL (ref 3.9–10.3)

## 2016-05-05 LAB — VITAMIN D 25 HYDROXY (VIT D DEFICIENCY, FRACTURES): VIT D 25 HYDROXY: 37.6 ng/mL (ref 30.0–100.0)

## 2016-05-11 ENCOUNTER — Ambulatory Visit (HOSPITAL_BASED_OUTPATIENT_CLINIC_OR_DEPARTMENT_OTHER): Payer: 59 | Admitting: Oncology

## 2016-05-11 VITALS — BP 142/82 | HR 77 | Temp 98.7°F | Resp 18 | Ht 64.0 in | Wt 194.3 lb

## 2016-05-11 DIAGNOSIS — C50411 Malignant neoplasm of upper-outer quadrant of right female breast: Secondary | ICD-10-CM | POA: Diagnosis not present

## 2016-05-11 DIAGNOSIS — N951 Menopausal and female climacteric states: Secondary | ICD-10-CM | POA: Diagnosis not present

## 2016-05-11 MED ORDER — GABAPENTIN 300 MG PO CAPS: 300.0000 mg | ORAL_CAPSULE | Freq: Every day | ORAL | Status: DC

## 2016-05-11 MED ORDER — ANASTROZOLE 1 MG PO TABS: 1.0000 mg | ORAL_TABLET | Freq: Every day | ORAL | Status: DC

## 2016-05-11 MED ORDER — VENLAFAXINE HCL ER 37.5 MG PO CP24: 37.5000 mg | ORAL_CAPSULE | Freq: Every day | ORAL | Status: DC

## 2016-05-11 MED FILL — GABAPENTIN 300 MG CAPSULE: 300 | 90 days supply | Qty: 90 | Fill #0

## 2016-05-11 NOTE — Progress Notes (Signed)
[place]  [phone] [phone]     ID: Deborah Mercado DOB: 09/23/1951  MR#: [id number]  EYE#:[id number]  Patient Care Team: Briscoe Deutscher, MD as PCP - General (Family Medicine) Kem Boroughs, FNP as Nurse Practitioner (Nurse Practitioner) Autumn Messing III, MD as Consulting Physician (General Surgery) Chauncey Cruel, MD as Consulting Physician (Oncology) Eppie Gibson, MD as Attending Physician (Radiation Oncology) Rockwell Germany, RN as Registered Nurse Mauro Kaufmann, RN as Registered Nurse Holley Bouche, NP as Nurse Practitioner (Nurse Practitioner) Sylvan Cheese, NP as Nurse Practitioner (Hematology and Oncology) PCP: Abigail Miyamoto, MD GYN: Roe Coombs, NP OTHER MD:  CHIEF COMPLAINT: Estrogen receptor positive breast cancer  CURRENT TREATMENT:  Anastrozole   BREAST CANCER HISTORY: From the original intake note:  Deborah Mercado had routine bilateral screening mammography at the breast Center for 20 04/19/2015. This showed the breast density to be category B. A possible mass in the right breast with 2 diagnostic right mammography with tomosynthesis and right breast ultrasonography 04/01/2015. There was a small spiculated mass in the central right breast at approximately 11:30 o'clock. There was no palpable abnormality by exam. Ultrasound confirmed an irregularly marginated hypoechoic mass measuring 6 mm in the area in question. Ultrasound of the right axilla was unremarkable.  Biopsy of the mass in question was obtained 04/01/2015 and showed ([id number]) and invasive ductal carcinoma, grade 1, estrogen receptor 75% positive, progesterone receptor 90% positive, both with strong staining intensity, with an MIB-1 of 5%, and no HER-2 amplification, the signals ratio being 1.79 and the number per cell 2.60.  The patient's subsequent history is as detailed below  INTERVAL HISTORY: Deborah Mercado returns today for follow-up of her  right-sided breast cancer. She continues on anastrozole, generally with good tolerance. She does have hot flashes, worse at night than during the day. She opt her gabapentin to 300 mg at bedtime and that has helped. She's also concordant venlafaxine low dose. She obtains a drug at a very good price.  REVIEW OF SYSTEMS: She has started using the stationary bicycle at [facility], and of course she also walks her dogs. The little girl dog has now been found to have an enlarged heart and needs to be walk brief period but several times a day. Deborah Mercado complains of mild fatigue, mild heartburn for which she takes Protonix, stress urinary incontinence which is not bad enough to want a referral to urology, and mild vaginal dryness issues. A detailed review of systems today was otherwise stable.  PAST MEDICAL HISTORY: Past Medical History  Diagnosis Date  . Vitamin D deficiency   . SUI (stress urinary incontinence, female)   . GERD (gastroesophageal reflux disease)   . Allergy   . Breast cancer ([place]) 04/01/15    Right breast Invasive ductl carcinoma    PAST SURGICAL HISTORY: Past Surgical History  Procedure Laterality Date  . Breast lumpectomy with radioactive seed and sentinel lymph node biopsy Right 04/19/2015    Procedure: BREAST LUMPECTOMY WITH RADIOACTIVE SEED AND SENTINEL LYMPH NODE MAPPING;  Surgeon: Autumn Messing III, MD;  Location: [facility];  Service: General;  Laterality: Right;    FAMILY HISTORY Family History  Problem Relation Age of Onset  . Heart disease Mother   . Rheum arthritis Mother   . Hypertension Mother   . Rheum arthritis Maternal Grandmother   . Heart attack Maternal Grandmother   . Heart disease Maternal Grandmother   . Breast cancer Cousin  the patient's parents are still living, in their mid 32s. The patient had no brothers, one sister. There is no history of breast or ovarian cancer in the family to her knowledge.  GYNECOLOGIC HISTORY:  Patient's last  menstrual period was 11/30/2004 (approximate). Menarche age 48, first live birth age 22. The patient is GX P1. She has been on hormone replacement until this diagnosis, with her last dose dose 04/05/2015.  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 She is divorced and lives I herself with 2 [name] and approximately 11 birds. Her daughter Deborah Mercado lives in [place] where she is supply Electronics engineer for a [employer].    ADVANCED DIRECTIVES: Not in place. At the 05/11/2016 visit the patient was given the appropriate forms to complete and notarize at her discretion.   HEALTH MAINTENANCE: Social History  Substance Use Topics  . Smoking status: Never Smoker   . Smokeless tobacco: Never Used  . Alcohol Use: Yes     Comment: occ     Colonoscopy:  PAP:  Bone density:  Lipid panel:  Allergies  Allergen Reactions  . Influenza Vaccines   . Monistat [Miconazole] Other (See Comments)    Severe burning and pain with use    Current Outpatient Prescriptions  Medication Sig Dispense Refill  . anastrozole (ARIMIDEX) 1 MG tablet Take 1 tablet (1 mg total) by mouth daily. 90 tablet 4  . gabapentin (NEURONTIN) 100 MG capsule Take 3 capsules by mouth daily.  4  . pantoprazole (PROTONIX) 20 MG tablet Take 1 tablet (20 mg total) by mouth daily. 90 tablet 3  . solifenacin (VESICARE) 5 MG tablet Take 1 tablet (5 mg total) by mouth daily. 90 tablet 3  . venlafaxine XR (EFFEXOR-XR) 37.5 MG 24 hr capsule Take 1 capsule (37.5 mg total) by mouth daily with breakfast. 90 capsule 4  . Vitamin D, Ergocalciferol, (DRISDOL) 50000 units CAPS capsule Take 1 capsule (50,000 Units total) by mouth every 7 (seven) days. 30 capsule 3   No current facility-administered medications for this visit.    OBJECTIVE: Middle-aged white woman In no acute distress  Filed Vitals:   05/11/16 1034  BP: 142/82  Pulse: 77  Temp: 98.7 F (37.1 C)  Resp: 18     Body mass index is  33.34 kg/(m^2).    ECOG FS:0 - Asymptomatic  Sclerae unicteric, EOMs intact Oropharynx clear, dentition in good repair No cervical or supraclavicular adenopathy Lungs no rales or rhonchi Heart regular rate and rhythm Abd soft, nontender, positive bowel sounds MSK no focal spinal tenderness, no upper extremity lymphedema Neuro: nonfocal, well oriented, appropriate affect Breasts: The right breast status post lumpectomy and radiation. There is no evidence of local recurrence. The right axilla is benign. Left breast is unremarkable.   LAB RESULTS:  CMP     Component Value Date/Time   NA 142 05/04/2016 0940   NA 142 03/18/2015 1454   K 4.0 05/04/2016 0940   K 3.9 03/18/2015 1454   CL 108 03/18/2015 1454   CO2 24 05/04/2016 0940   CO2 24 03/18/2015 1454   GLUCOSE 94 05/04/2016 0940   GLUCOSE 87 03/18/2015 1454   BUN 10.0 05/04/2016 0940   BUN 8 03/18/2015 1454   CREATININE 0.9 05/04/2016 0940   CREATININE 0.76 03/18/2015 1454   CALCIUM 9.5 05/04/2016 0940   CALCIUM 8.7 03/18/2015 1454   PROT 7.8 05/04/2016 0940   PROT 6.8 03/18/2015 1454   ALBUMIN 3.8 05/04/2016 0940  ALBUMIN 4.0 03/18/2015 1454   AST 17 05/04/2016 0940   AST 15 03/18/2015 1454   ALT 12 05/04/2016 0940   ALT 10 03/18/2015 1454   ALKPHOS 94 05/04/2016 0940   ALKPHOS 68 03/18/2015 1454   BILITOT 0.66 05/04/2016 0940   BILITOT 0.8 03/18/2015 1454    INo results found for: SPEP, UPEP  Lab Results  Component Value Date   WBC 5.9 05/04/2016   NEUTROABS 3.8 05/04/2016   HGB 13.4 05/04/2016   HCT 40.8 05/04/2016   MCV 85.3 05/04/2016   PLT 191 05/04/2016      Chemistry      Component Value Date/Time   NA 142 05/04/2016 0940   NA 142 03/18/2015 1454   K 4.0 05/04/2016 0940   K 3.9 03/18/2015 1454   CL 108 03/18/2015 1454   CO2 24 05/04/2016 0940   CO2 24 03/18/2015 1454   BUN 10.0 05/04/2016 0940   BUN 8 03/18/2015 1454   CREATININE 0.9 05/04/2016 0940   CREATININE 0.76 03/18/2015 1454        Component Value Date/Time   CALCIUM 9.5 05/04/2016 0940   CALCIUM 8.7 03/18/2015 1454   ALKPHOS 94 05/04/2016 0940   ALKPHOS 68 03/18/2015 1454   AST 17 05/04/2016 0940   AST 15 03/18/2015 1454   ALT 12 05/04/2016 0940   ALT 10 03/18/2015 1454   BILITOT 0.66 05/04/2016 0940   BILITOT 0.8 03/18/2015 1454       No results found for: LABCA2  No components found for: LABCA125  No results for input(s): INR in the last 168 hours.  Urinalysis    Component Value Date/Time   BILIRUBINUR neg 04/06/2016 0829   PROTEINUR neg 04/06/2016 0829   UROBILINOGEN negative 04/06/2016 0829   NITRITE neg 04/06/2016 0829   LEUKOCYTESUR Negative 04/06/2016 0829    STUDIES: CLINICAL DATA: Patient underwent right lumpectomy for breast carcinoma in May 2016. No current complaints.  EXAM: 2D DIGITAL DIAGNOSTIC BILATERAL MAMMOGRAM WITH CAD AND ADJUNCT TOMO  COMPARISON: Previous exam(s).  ACR Breast Density Category b: There are scattered areas of fibroglandular density.  FINDINGS: There is focal opacity and associated architectural distortion and surgical vascular clips in the upper outer right breast reflecting the lumpectomy site.  Stable benign mass in the retroareolar left breast. No new or suspicious masses, no other areas of architectural distortion and no suspicious calcifications.  Mammographic images were processed with CAD.  IMPRESSION: No evidence of recurrent or new breast malignancy. Benign postsurgical changes on the right.  RECOMMENDATION: Diagnostic mammography in 1 year per standard post lumpectomy protocol.  I have discussed the findings and recommendations with the patient. Results were also provided in writing at the conclusion of the visit. If applicable, a reminder letter will be sent to the patient regarding the next appointment.  BI-RADS CATEGORY 2: Benign.   Electronically Signed  By: Lajean Manes M.D.  On: 04/06/2016 10:22    EXAM: DUAL X-RAY ABSORPTIOMETRY (DXA) FOR BONE MINERAL DENSITY  IMPRESSION: Referring Physician: Chauncey Cruel  PATIENT: Name: Wetona, Viramontes Patient ID: [id number] Birth Date: Apr 30, 1951 Height: 64.0 in. Sex: Female Measured: 04/06/2016 Weight: 191.0 lbs. Indications: Anastrazole, Breast Cancer History, Caucasian, Effexor, Estrogen Deficient, Family History of Osteoporosis, Height Loss (781.91), History of Fracture (Adult) (V15.51), Postmenopausal, Protonix, Vitamin D Deficient, Secondary Osteoporosis Fractures: Patella Treatments: Vitamin D (E933.5)  ASSESSMENT: The BMD measured at Femur Neck Right is 0.886 g/cm2 with a T-score of -1.1. This patient is considered osteopenic according to  World Health Organization [facility]) criteria.  Site Region Measured Date Measured Age YA BMD Significant CHANGE T-score DualFemur Neck Right 5/06/[age].3 -1.1 0.886 g/cm2       ASSESSMENT: 65 y.o. [place], [place] woman status post right breast biopsy 04/01/2015 for a clinical T1b N0, stage IA invasive ductal carcinoma, grade 1, estrogen and progesterone receptor positive, HER-2 not amplified, with an MIB-1 of 5%  (1) status post right lumpectomy and sentinel lymph node sampling 04/19/2015 for a pT1c pN0, stage IA invasive ductal carcinoma, grade 1, with negative margins  (2) Oncotype DX score of 13 predicts a 10 year risk of outside the breast recurrence of 9% if the patient's only systemic therapy is tamoxifen for 5 years. It also predicts no benefit from chemotherapy  (3) adjuvant radiation 06/11/2015-07/22/2015: 1)Right breast / 50 Gy in 25 fractions 2) Right breast boost / 10 Gy in 5 fractions  (4) started anastrozole 08/31/2015  (a) [facility] density at the [facility] 04/06/2016 shows a T score of - 1.1  PLAN: Deborah Mercado ) there is now a year out from definitive surgery for her breast cancer with no evidence of disease recurrence. This is very favorable.  I am  opting her gabapentin to 300 at bedtime. She is tolerating this well and it is helping the nighttime hot flashes. Of course we are continuing the venlafaxine as well.  Today I gave her information on our intimacy and pelvic health class and encouraged her to participate. Also I suggested she completed a healthcare part of attorney form. We went over that in detail. If she does get it notarize she will bring [place] a copy so we can scan at in her chart.  I discussed her need for a colonoscopy. She is not quite ready for that yet. We will continue to discuss that.  Otherwise she will see me again in 6 months. She knows to call for any problems that may develop before her next visit here.  Chauncey Cruel, MD   05/11/2016 10:59 AM Medical Oncology and Hematology [facility] [address] [place], [place] [address] Tel. [phone]    Fax. [phone]

## 2016-05-12 ENCOUNTER — Telehealth: Payer: Self-pay | Admitting: Oncology

## 2016-05-12 MED FILL — PANTOPRAZOLE SOD DR 20 MG T: 20 | 90 days supply | Qty: 90 | Fill #0

## 2016-05-12 NOTE — Telephone Encounter (Signed)
appt made and letter sent by mail °

## 2016-05-29 MED FILL — ANASTROZOLE 1 MG TABLET: 1 | 90 days supply | Qty: 90 | Fill #2

## 2016-05-29 MED FILL — VENLAFAXINE HCL ER 37.5 MG: 37.5 | 90 days supply | Qty: 90 | Fill #2

## 2016-07-14 MED FILL — VIT D2 1.25 MG (50,000 UNIT: 1.25 MG | 84 days supply | Qty: 12 | Fill #0

## 2016-08-27 MED FILL — VENLAFAXINE HCL ER 37.5 MG: 37.5 | 90 days supply | Qty: 90 | Fill #3

## 2016-08-27 MED FILL — PANTOPRAZOLE SOD DR 20 MG T: 20 | 90 days supply | Qty: 90 | Fill #1

## 2016-08-27 MED FILL — GABAPENTIN 300 MG CAPSULE: 300 | 90 days supply | Qty: 90 | Fill #1

## 2016-08-27 MED FILL — ANASTROZOLE 1 MG TABLET: 1 | 90 days supply | Qty: 90 | Fill #3

## 2016-10-01 ENCOUNTER — Encounter: Payer: Self-pay | Admitting: Certified Nurse Midwife

## 2016-10-01 ENCOUNTER — Ambulatory Visit (INDEPENDENT_AMBULATORY_CARE_PROVIDER_SITE_OTHER): Payer: 59 | Admitting: Certified Nurse Midwife

## 2016-10-01 VITALS — BP 120/72 | HR 84 | Temp 98.1°F | Resp 16 | Ht 64.0 in | Wt 195.0 lb

## 2016-10-01 DIAGNOSIS — N390 Urinary tract infection, site not specified: Secondary | ICD-10-CM

## 2016-10-01 DIAGNOSIS — B373 Candidiasis of vulva and vagina: Secondary | ICD-10-CM | POA: Diagnosis not present

## 2016-10-01 DIAGNOSIS — N952 Postmenopausal atrophic vaginitis: Secondary | ICD-10-CM | POA: Diagnosis not present

## 2016-10-01 DIAGNOSIS — B3731 Acute candidiasis of vulva and vagina: Secondary | ICD-10-CM

## 2016-10-01 LAB — POCT URINALYSIS DIPSTICK
BILIRUBIN UA: NEGATIVE
GLUCOSE UA: NEGATIVE
Ketones, UA: NEGATIVE
Leukocytes, UA: NEGATIVE
Nitrite, UA: NEGATIVE
Protein, UA: NEGATIVE
RBC UA: NEGATIVE
Urobilinogen, UA: NEGATIVE
pH, UA: 7

## 2016-10-01 MED ORDER — NYSTATIN-TRIAMCINOLONE 100000-0.1 UNIT/GM-% EX CREA: 1.0000 "application " | TOPICAL_CREAM | Freq: Two times a day (BID) | CUTANEOUS | 0 refills | Status: DC

## 2016-10-01 MED FILL — NYSTATIN/TRIAMCINOLONE CRM: 100000-0.1 | 7 days supply | Qty: 60 | Fill #0

## 2016-10-01 NOTE — Progress Notes (Signed)
65 y.o. Divorced Caucasian female G1P1001 here with complaint of UTI, with onset  on one week ago.. Patient complaining of urinary frequency/urgency with urination. Patient takes Vesicare for urgency. Patient denies fever, chills, nausea or back pain. No new personal products. Patient not sexual activity. Denies any vaginal symptoms. Or dryness  . Menopausal no HRT,due to diagnosis of Breast cancer last year. Now on Arimidex. Patient not drinking adequate water intake, more soda than water. No other problems today.   O: Healthy female WDWN Affect: Normal, orientation x 3 Skin : warm and dry CVAT: negative bilateral Abdomen: negative for suprapubic tenderness  Pelvic exam: External genital area: normal female with redness and scaling noted with exudate. Wet prep taken from external tissue only  Bladder,Urethra non tender, Urethral meatus:non tender Vagina: scant to no vaginal discharge, atrophicl appearance  Affirm takne Cervix: normal, non tender Uterus:normal,non tender Adnexa: normal non tender, no fullness or masses  Wet prep: external with KOH,Saline, positive for yeast  A:Normal pelvic exam poct urine-ph 7.0 Urine negative Yeast vulvitis Atrophic vaginitis  R/O yeast vaginitis P: Reviewed findings of yeast vulvitis and etiology. Feel this may be causing her urinary symptoms with negative exam and urine. Questions addressed. [id number] cream see order with instructions Will treat per affirm if positive from vagina Discussed atrophic vaginitis and dryness, which can cause urinary symptoms. Discussed etiology and options for treatment OTC with instructions. Will try and advise if no change or problems.  Reviewed warning signs and symptoms of UTI and need to advise if occurring. Encouraged to limit soda, tea, and coffee and be sure to increase water intake.   RV prn

## 2016-10-01 NOTE — Patient Instructions (Signed)

## 2016-10-02 LAB — WET PREP BY MOLECULAR PROBE
Candida species: NEGATIVE
Gardnerella vaginalis: NEGATIVE
TRICHOMONAS VAG: NEGATIVE

## 2016-10-05 NOTE — Progress Notes (Signed)
Encounter reviewed Careem Yasui, MD   

## 2016-10-06 ENCOUNTER — Telehealth: Payer: Self-pay | Admitting: Certified Nurse Midwife

## 2016-10-06 NOTE — Telephone Encounter (Signed)
Patient was seen last week and feels that she needs diflucan. Patient says [facility] "always gives her this prescription when she has this problem" Patient feels that she should have been given diflucan at her appointment last week. Patient is not available by phone this afternoon and is asking for a call back tomorrow morning.

## 2016-10-07 NOTE — Telephone Encounter (Signed)
Left message to call Deborah Mercado at [phone].  

## 2016-10-07 NOTE — Telephone Encounter (Signed)
Spoke with patient. Patient states she was in for OV 10/01/16. Patient states lower abdominal discomfort and urinary frequency has gotten worse last couple of days. Patient denies vaginal discharge, odor, or fever. Denies pain with urination.  Patient states she feels she needs Diflucan. Advised patient of lab results from 11/2 as seen below. Patient states this has happened before and required amoxicillin and diflucan. Advised patient I can review with Melvia Heaps, CNM and return call with any additional recommendations.   Melvia Heaps, CNM -please advise?  CC: Kem Boroughs, NP   Notes Recorded by Susy Manor, CMA on 10/02/2016 at 11:23 AM EDT Patient notified of normal results as written by provider ------  Notes Recorded by Regina Eck, CNM on 10/02/2016 at 8:02 AM EDT Notify patient that vaginal wet prep negative for yeast, BV and Trichomonas

## 2016-10-07 NOTE — Telephone Encounter (Signed)
If she feels symptoms are worse she needs to be evaluated, but she had vaginal dryness also.

## 2016-10-21 NOTE — Telephone Encounter (Signed)
Spoke with patient. Advised was returning call to f/u from originally call 11/8. Patient states she is feeling better, no other concerns/questions at this time.   Routing to provider for final review. Patient is agreeable to disposition. Will close encounter.   Cc: Kem Boroughs, NP

## 2016-11-03 ENCOUNTER — Other Ambulatory Visit (HOSPITAL_BASED_OUTPATIENT_CLINIC_OR_DEPARTMENT_OTHER): Payer: 59

## 2016-11-03 DIAGNOSIS — C50411 Malignant neoplasm of upper-outer quadrant of right female breast: Secondary | ICD-10-CM

## 2016-11-03 LAB — CBC WITH DIFFERENTIAL/PLATELET
BASO%: 0.6 % (ref 0.0–2.0)
BASOS ABS: 0 10*3/uL (ref 0.0–0.1)
EOS ABS: 0.1 10*3/uL (ref 0.0–0.5)
EOS%: 1.9 % (ref 0.0–7.0)
HEMATOCRIT: 40.6 % (ref 34.8–46.6)
HEMOGLOBIN: 13.2 g/dL (ref 11.6–15.9)
LYMPH#: 1.5 10*3/uL (ref 0.9–3.3)
LYMPH%: 24.1 % (ref 14.0–49.7)
MCH: 28 pg (ref 25.1–34.0)
MCHC: 32.7 g/dL (ref 31.5–36.0)
MCV: 85.6 fL (ref 79.5–101.0)
MONO#: 0.4 10*3/uL (ref 0.1–0.9)
MONO%: 6 % (ref 0.0–14.0)
NEUT#: 4.3 10*3/uL (ref 1.5–6.5)
NEUT%: 67.4 % (ref 38.4–76.8)
PLATELETS: 200 10*3/uL (ref 145–400)
RBC: 4.74 10*6/uL (ref 3.70–5.45)
RDW: 14.2 % (ref 11.2–14.5)
WBC: 6.4 10*3/uL (ref 3.9–10.3)

## 2016-11-03 LAB — COMPREHENSIVE METABOLIC PANEL
ALBUMIN: 3.7 g/dL (ref 3.5–5.0)
ALK PHOS: 121 U/L (ref 40–150)
ALT: 14 U/L (ref 0–55)
ANION GAP: 9 meq/L (ref 3–11)
AST: 18 U/L (ref 5–34)
BUN: 9.2 mg/dL (ref 7.0–26.0)
CALCIUM: 9.3 mg/dL (ref 8.4–10.4)
CO2: 26 mEq/L (ref 22–29)
Chloride: 108 mEq/L (ref 98–109)
Creatinine: 0.8 mg/dL (ref 0.6–1.1)
EGFR: 78 mL/min/{1.73_m2} — AB (ref >90)
Glucose: 100 mg/dl (ref 70–140)
POTASSIUM: 3.9 meq/L (ref 3.5–5.1)
Sodium: 143 mEq/L (ref 136–145)
Total Bilirubin: 0.52 mg/dL (ref 0.20–1.20)
Total Protein: 7.6 g/dL (ref 6.4–8.3)

## 2016-11-09 NOTE — Progress Notes (Signed)
[place]  [phone] Fax:([phone]) [phone]     ID: VYLA PINT DOB: [birth year]  MR#: [id number]  QPR#:[id number]  Patient Care Team: Briscoe Deutscher, MD as PCP - General (Family Medicine) Kem Boroughs, FNP as Nurse Practitioner (Nurse Practitioner) Autumn Messing III, MD as Consulting Physician (General Surgery) Chauncey Cruel, MD as Consulting Physician (Oncology) Eppie Gibson, MD as Attending Physician (Radiation Oncology) Rockwell Germany, RN as Registered Nurse Mauro Kaufmann, RN as Registered Nurse Holley Bouche, NP as Nurse Practitioner (Nurse Practitioner) Sylvan Cheese, NP as Nurse Practitioner (Hematology and Oncology) OTHER MD:  CHIEF COMPLAINT: Estrogen receptor positive breast cancer  CURRENT TREATMENT:  Anastrozole   BREAST CANCER HISTORY: From the original intake note:  Deborah Mercado had routine bilateral screening mammography at the [facility] Center for 20 04/19/2015. This showed the breast density to be category B. A possible mass in the right breast with 2 diagnostic right mammography with tomosynthesis and right breast ultrasonography 04/01/2015. There was a small spiculated mass in the central right breast at approximately 11:30 o'clock. There was no palpable abnormality by exam. Ultrasound confirmed an irregularly marginated hypoechoic mass measuring 6 mm in the area in question. Ultrasound of the right axilla was unremarkable.  Biopsy of the mass in question was obtained 04/01/2015 and showed ([id number]) and invasive ductal carcinoma, grade 1, estrogen receptor 75% positive, progesterone receptor 90% positive, both with strong staining intensity, with an MIB-1 of 5%, and no HER-2 amplification, the signals ratio being 1.79 and the number per cell 2.60.  The patient's subsequent history is as detailed below  INTERVAL HISTORY: Deborah Mercado returns today for follow-up of her estrogen receptor positive breast cancer. She tolerates  anastrozole well.Hot flashes are not a major issue. She is using Replens for the vaginal dryness problem. She never developed the arthralgias or myalgias that many patients can experience on this medication. She obtains it at a good price.    REVIEW OF SYSTEMS: She has a new schnauzers or and he takes her for walks several times a day when she is not working. Otherwise she does not exercise regularly. A detailed review of systems today was noncontributory   PAST MEDICAL HISTORY: Past Medical History:  Diagnosis Date  . Allergy   . Breast cancer ([place]) 04/01/15   Right breast Invasive ductl carcinoma  . GERD (gastroesophageal reflux disease)   . SUI (stress urinary incontinence, female)   . Vitamin D deficiency     PAST SURGICAL HISTORY: Past Surgical History:  Procedure Laterality Date  . BREAST LUMPECTOMY WITH RADIOACTIVE SEED AND SENTINEL LYMPH NODE BIOPSY Right 04/19/2015   Procedure: BREAST LUMPECTOMY WITH RADIOACTIVE SEED AND SENTINEL LYMPH NODE MAPPING;  Surgeon: Autumn Messing III, MD;  Location: [facility];  Service: General;  Laterality: Right;    FAMILY HISTORY Family History  Problem Relation Age of Onset  . Heart disease Mother   . Rheum arthritis Mother   . Hypertension Mother   . Rheum arthritis Maternal Grandmother   . Heart attack Maternal Grandmother   . Heart disease Maternal Grandmother   . Breast cancer Cousin    the patient's parents are still living, in their mid 43s. The patient had no brothers, one sister. There is no history of breast or ovarian cancer in the family to her knowledge.  GYNECOLOGIC HISTORY:  Patient's last menstrual period was 11/30/2004 (approximate). Menarche age 64, first live birth age 35. The patient is GX P1. She has been on  hormone replacement until this diagnosis, with her last dose dose 04/05/2015.  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 She is divorced and lives I herself with a Industrial/product designer and approximately 25 birds. Her daughter [name] lives in [place] where she is supply Electronics engineer for a [employer].    ADVANCED DIRECTIVES: Not in place. At the 05/11/2016 visit the patient was given the appropriate forms to complete and notarize at her discretion.   HEALTH MAINTENANCE: Social History  Substance Use Topics  . Smoking status: Never Smoker  . Smokeless tobacco: Never Used  . Alcohol use No     Colonoscopy:  PAP:  Bone density:  Lipid panel:  Allergies  Allergen Reactions  . Influenza Vaccines   . Monistat [Miconazole] Other (See Comments)    Severe burning and pain with use    Current Outpatient Prescriptions  Medication Sig Dispense Refill  . anastrozole (ARIMIDEX) 1 MG tablet Take 1 tablet (1 mg total) by mouth daily. 90 tablet 4  . gabapentin (NEURONTIN) 300 MG capsule Take 1 capsule (300 mg total) by mouth daily. 90 capsule 4  . nystatin-triamcinolone (MYCOLOG II) cream Apply 1 application topically 2 (two) times daily. Apply to affected area BID for up to 7 days. 60 g 0  . pantoprazole (PROTONIX) 20 MG tablet Take 1 tablet (20 mg total) by mouth daily. 90 tablet 3  . solifenacin (VESICARE) 5 MG tablet Take 1 tablet (5 mg total) by mouth daily. 90 tablet 3  . venlafaxine XR (EFFEXOR-XR) 37.5 MG 24 hr capsule Take 1 capsule (37.5 mg total) by mouth daily with breakfast. 90 capsule 4  . Vitamin D, Ergocalciferol, (DRISDOL) 50000 units CAPS capsule Take 1 capsule (50,000 Units total) by mouth every 7 (seven) days. 30 capsule 3   No current facility-administered medications for this visit.     OBJECTIVE: Middle-aged white woman Will appear stated age 65:   11/10/16 1305  BP: (!) 144/78  Pulse: 82  Resp: 18  Temp: 98.2 F (36.8 C)     Body mass index is 33.92 kg/m.    ECOG FS:0 - Asymptomatic  Sclerae unicteric, pupils round and equal Oropharynx clear and moist-- no thrush or other lesions No cervical or supraclavicular  adenopathy Lungs no rales or rhonchi Heart regular rate and rhythm Abd soft, nontender, positive bowel sounds MSK no focal spinal tenderness, no upper extremity lymphedema Neuro: nonfocal, well oriented, appropriate affect Breasts: The right breast is status post lumpectomy followed by radiation. There is no local recurrence by inspection or palpation. The right axilla is benign. The left breast is benign.   LAB RESULTS:  CMP     Component Value Date/Time   NA 143 11/03/2016 1121   K 3.9 11/03/2016 1121   CL 108 03/18/2015 1454   CO2 26 11/03/2016 1121   GLUCOSE 100 11/03/2016 1121   BUN 9.2 11/03/2016 1121   CREATININE 0.8 11/03/2016 1121   CALCIUM 9.3 11/03/2016 1121   PROT 7.6 11/03/2016 1121   ALBUMIN 3.7 11/03/2016 1121   AST 18 11/03/2016 1121   ALT 14 11/03/2016 1121   ALKPHOS 121 11/03/2016 1121   BILITOT 0.52 11/03/2016 1121    INo results found for: SPEP, UPEP  Lab Results  Component Value Date   WBC 6.4 11/03/2016   NEUTROABS 4.3 11/03/2016   HGB 13.2 11/03/2016   HCT 40.6 11/03/2016   MCV 85.6 11/03/2016   PLT 200 11/03/2016  Chemistry      Component Value Date/Time   NA 143 11/03/2016 1121   K 3.9 11/03/2016 1121   CL 108 03/18/2015 1454   CO2 26 11/03/2016 1121   BUN 9.2 11/03/2016 1121   CREATININE 0.8 11/03/2016 1121      Component Value Date/Time   CALCIUM 9.3 11/03/2016 1121   ALKPHOS 121 11/03/2016 1121   AST 18 11/03/2016 1121   ALT 14 11/03/2016 1121   BILITOT 0.52 11/03/2016 1121       No results found for: LABCA2  No components found for: LABCA125  No results for input(s): INR in the last 168 hours.  Urinalysis    Component Value Date/Time   BILIRUBINUR n 10/01/2016 1256   PROTEINUR n 10/01/2016 1256   UROBILINOGEN negative 10/01/2016 1256   NITRITE n 10/01/2016 1256   LEUKOCYTESUR Negative 10/01/2016 1256    STUDIES: CLINICAL DATA: Patient underwent right lumpectomy for breast carcinoma in May 2016. No  current complaints.  EXAM: 2D DIGITAL DIAGNOSTIC BILATERAL MAMMOGRAM WITH CAD AND ADJUNCT TOMO  COMPARISON: Previous exam(s).  ACR Breast Density Category b: There are scattered areas of fibroglandular density.  FINDINGS: There is focal opacity and associated architectural distortion and surgical vascular clips in the upper outer right breast reflecting the lumpectomy site.  Stable benign mass in the retroareolar left breast. No new or suspicious masses, no other areas of architectural distortion and no suspicious calcifications.  Mammographic images were processed with CAD.  IMPRESSION: No evidence of recurrent or new breast malignancy. Benign postsurgical changes on the right.  RECOMMENDATION: Diagnostic mammography in 1 year per standard post lumpectomy protocol.  I have discussed the findings and recommendations with the patient. Results were also provided in writing at the conclusion of the visit. If applicable, a reminder letter will be sent to the patient regarding the next appointment.  BI-RADS CATEGORY 2: Benign.   Electronically Signed  By: Lajean Manes M.D.  On: 04/06/2016 10:22  ASSESSMENT: 65 y.o. [place], [place] woman status post right breast upper outer quadrant biopsy 04/01/2015 for a clinical T1b N0, stage IA invasive ductal carcinoma, grade 1, estrogen and progesterone receptor positive, HER-2 not amplified, with an MIB-1 of 5%  (1) status post right lumpectomy and sentinel lymph node sampling 04/19/2015 for a pT1c pN0, stage IA invasive ductal carcinoma, grade 1, with negative margins  (2) Oncotype DX score of 13 predicts a 10 year risk of outside the breast recurrence of 9% if the patient's only systemic therapy is tamoxifen for 5 years. It also predicts no benefit from chemotherapy  (3) adjuvant radiation 06/11/2015-07/22/2015: 1)Right breast / 50 Gy in 25 fractions 2) Right breast boost / 10 Gy in 5 fractions  (4) started  anastrozole 08/31/2015  (a) bone density at the [facility] 04/06/2016 shows a T score of - 1.1  PLAN: Deborah Mercado is now a year and a half out from definitive surgery for her breast cancer with no evidence of disease recurrence. This is favorable.  She is tolerating anastrozole well and the plan is to continue that at least another year, at which point if she wishes we can consider switching to tamoxifen. That might allow her to safely use vaginal estrogens if vaginal dryness is a major concern  Otherwise she will see me again in a year. She knows to call for any problems that may develop before that visit.  Chauncey Cruel, MD   11/10/2016 4:45 PM Medical Oncology and Hematology [facility] [address]  [place], [place] [address] Tel. [phone]    Fax. [phone]

## 2016-11-10 ENCOUNTER — Ambulatory Visit (HOSPITAL_BASED_OUTPATIENT_CLINIC_OR_DEPARTMENT_OTHER): Payer: 59 | Admitting: Oncology

## 2016-11-10 VITALS — BP 144/78 | HR 82 | Temp 98.2°F | Resp 18 | Ht 64.0 in | Wt 197.6 lb

## 2016-11-10 DIAGNOSIS — C50411 Malignant neoplasm of upper-outer quadrant of right female breast: Secondary | ICD-10-CM

## 2016-11-10 DIAGNOSIS — Z170 Estrogen receptor positive status [ER+]: Secondary | ICD-10-CM

## 2016-11-10 MED ORDER — ANASTROZOLE 1 MG PO TABS: 1.0000 mg | ORAL_TABLET | Freq: Every day | ORAL | 4 refills | Status: DC

## 2016-11-10 MED ORDER — GABAPENTIN 300 MG PO CAPS: 300.0000 mg | ORAL_CAPSULE | Freq: Every day | ORAL | 4 refills | Status: DC

## 2016-11-25 ENCOUNTER — Other Ambulatory Visit: Payer: Self-pay | Admitting: Oncology

## 2016-11-25 MED FILL — VIT D2 1.25 MG (50,000 UNIT: 1.25 MG | 84 days supply | Qty: 12 | Fill #1

## 2016-11-25 MED FILL — PANTOPRAZOLE SOD DR 20 MG T: 20 | 90 days supply | Qty: 90 | Fill #2

## 2016-11-25 MED FILL — ANASTROZOLE 1 MG TABLET: 1 | 90 days supply | Qty: 90 | Fill #0

## 2016-11-26 MED FILL — VENLAFAXINE HCL ER 37.5 MG: 37.5 | 90 days supply | Qty: 90 | Fill #0

## 2017-01-21 MED FILL — GABAPENTIN 300 MG CAPSULE: 300 | 90 days supply | Qty: 90 | Fill #2

## 2017-02-22 MED FILL — PANTOPRAZOLE SOD DR 20 MG T: 20 | 90 days supply | Qty: 90 | Fill #3

## 2017-02-22 MED FILL — VENLAFAXINE HCL ER 37.5 MG: 37.5 | 90 days supply | Qty: 90 | Fill #1

## 2017-02-22 MED FILL — ANASTROZOLE 1 MG TABLET: 1 | 90 days supply | Qty: 90 | Fill #1

## 2017-02-26 ENCOUNTER — Other Ambulatory Visit: Payer: Self-pay | Admitting: Oncology

## 2017-02-26 DIAGNOSIS — Z9889 Other specified postprocedural states: Secondary | ICD-10-CM

## 2017-04-12 ENCOUNTER — Ambulatory Visit
Admission: RE | Admit: 2017-04-12 | Discharge: 2017-04-12 | Disposition: A | Discharge status: Home / Self Care | Payer: 59 | Source: Ambulatory Visit | Attending: Oncology | Admitting: Oncology

## 2017-04-12 ENCOUNTER — Encounter: Payer: Self-pay | Admitting: Nurse Practitioner

## 2017-04-12 ENCOUNTER — Ambulatory Visit (INDEPENDENT_AMBULATORY_CARE_PROVIDER_SITE_OTHER): Payer: 59 | Admitting: Nurse Practitioner

## 2017-04-12 VITALS — BP 124/82 | HR 64 | Ht 64.5 in | Wt 195.0 lb

## 2017-04-12 DIAGNOSIS — Z170 Estrogen receptor positive status [ER+]: Secondary | ICD-10-CM

## 2017-04-12 DIAGNOSIS — B373 Candidiasis of vulva and vagina: Secondary | ICD-10-CM

## 2017-04-12 DIAGNOSIS — Z01411 Encounter for gynecological examination (general) (routine) with abnormal findings: Secondary | ICD-10-CM

## 2017-04-12 DIAGNOSIS — Z0000 Encounter for general adult medical examination without abnormal findings: Secondary | ICD-10-CM

## 2017-04-12 DIAGNOSIS — N952 Postmenopausal atrophic vaginitis: Secondary | ICD-10-CM | POA: Diagnosis not present

## 2017-04-12 DIAGNOSIS — E559 Vitamin D deficiency, unspecified: Secondary | ICD-10-CM

## 2017-04-12 DIAGNOSIS — B3731 Acute candidiasis of vulva and vagina: Secondary | ICD-10-CM

## 2017-04-12 DIAGNOSIS — Z1211 Encounter for screening for malignant neoplasm of colon: Secondary | ICD-10-CM | POA: Diagnosis not present

## 2017-04-12 DIAGNOSIS — C50411 Malignant neoplasm of upper-outer quadrant of right female breast: Secondary | ICD-10-CM

## 2017-04-12 DIAGNOSIS — Z9889 Other specified postprocedural states: Secondary | ICD-10-CM

## 2017-04-12 DIAGNOSIS — R928 Other abnormal and inconclusive findings on diagnostic imaging of breast: Secondary | ICD-10-CM | POA: Diagnosis not present

## 2017-04-12 HISTORY — DX: Personal history of irradiation: Z92.3

## 2017-04-12 LAB — TSH: TSH: 1.87 m[IU]/L

## 2017-04-12 MED ORDER — VITAMIN D (ERGOCALCIFEROL) 1.25 MG (50000 UNIT) PO CAPS: 50000.0000 [IU] | ORAL_CAPSULE | ORAL | 3 refills | Status: DC

## 2017-04-12 MED ORDER — SOLIFENACIN SUCCINATE 5 MG PO TABS: 5.0000 mg | ORAL_TABLET | Freq: Every day | ORAL | 3 refills | Status: DC

## 2017-04-12 MED ORDER — PANTOPRAZOLE SODIUM 20 MG PO TBEC: 20.0000 mg | DELAYED_RELEASE_TABLET | Freq: Every day | ORAL | 3 refills | Status: DC

## 2017-04-12 MED ORDER — NYSTATIN-TRIAMCINOLONE 100000-0.1 UNIT/GM-% EX CREA
1.0000 | TOPICAL_CREAM | Freq: Two times a day (BID) | CUTANEOUS | 1 refills | Status: DC
Start: 2017-04-12 — End: 2017-09-13

## 2017-04-12 NOTE — Progress Notes (Signed)
66 y.o. G63P1001 Divorced  Caucasian Fe here for annual exam.  No new health problem.  Still on Arimidex since 07/2015 and will continue X 5 yrs.  She continues of Vesicare with much help with urge incontinence.  She continues to wear a pad daily and at times will get some external yeast symptoms.  Same partner but not SA in yrs.  Patient's last menstrual period was 11/30/2004 (approximate).          Sexually active: No.  The current method of family planning is post menopausal status.    Exercising: No.  The patient does not participate in regular exercise at present. Smoker:  no  Health Maintenance: Pap: 04/06/16, Negative with neg HR HPV  12/02/12, Negative with neg HR HPV History of Abnormal Pap: no MMG: 04/06/16, 3D-yes, Density Category B, Bi-Rads 2:  Benign, appointment today Self Breast exams: yes Colonoscopy: Never - declines - will give her IFOB BMD: 04/06/16 T Score: -0.9 Spine / -1.1 Right Femur Neck / -1.0 Left Femur Neck TDaP: 2009 Shingles: Never - declines Pneumonia: Never Hep C and HIV: 04/06/16 Labs: PCP takes care of screening labs, we follow Vitamin D   reports that she has never smoked. She has never used smokeless tobacco. She reports that she does not drink alcohol or use drugs.  Past Medical History:  Diagnosis Date  . Allergy   . Breast cancer ([place] [facility]) 04/01/15   Right breast Invasive ductl carcinoma  . GERD (gastroesophageal reflux disease)   . Personal history of radiation therapy 2016  . SUI (stress urinary incontinence, female)   . Vitamin D deficiency     Past Surgical History:  Procedure Laterality Date  . BREAST BIOPSY Right 04/01/2015   malignant  . BREAST LUMPECTOMY Right 04/19/2015  . BREAST LUMPECTOMY WITH RADIOACTIVE SEED AND SENTINEL LYMPH NODE BIOPSY Right 04/19/2015   Procedure: BREAST LUMPECTOMY WITH RADIOACTIVE SEED AND SENTINEL LYMPH NODE MAPPING;  Surgeon: Autumn Messing III, MD;  Location: [facility];  Service: General;  Laterality: Right;     Current Outpatient Prescriptions  Medication Sig Dispense Refill  . anastrozole (ARIMIDEX) 1 MG tablet Take 1 tablet (1 mg total) by mouth daily. 90 tablet 4  . gabapentin (NEURONTIN) 300 MG capsule Take 1 capsule (300 mg total) by mouth daily. 90 capsule 4  . nystatin-triamcinolone (MYCOLOG II) cream Apply 1 application topically 2 (two) times daily. Apply to affected area BID for up to 7 days. 60 g 1  . pantoprazole (PROTONIX) 20 MG tablet Take 1 tablet (20 mg total) by mouth daily. 90 tablet 3  . solifenacin (VESICARE) 5 MG tablet Take 1 tablet (5 mg total) by mouth daily. 90 tablet 3  . venlafaxine XR (EFFEXOR-XR) 37.5 MG 24 hr capsule Take 1 capsule (37.5 mg total) by mouth daily with breakfast. 90 capsule 4  . Vitamin D, Ergocalciferol, (DRISDOL) 50000 units CAPS capsule Take 1 capsule (50,000 Units total) by mouth every 7 (seven) days. 30 capsule 3   No current facility-administered medications for this visit.     Family History  Problem Relation Age of Onset  . Heart disease Mother   . Rheum arthritis Mother   . Hypertension Mother   . Rheum arthritis Maternal Grandmother   . Heart attack Maternal Grandmother   . Heart disease Maternal Grandmother   . Breast cancer Cousin     ROS:  Pertinent items are noted in HPI.  Otherwise, a comprehensive ROS was negative.  Exam:  BP 124/82 (BP Location: Left Arm, Patient Position: Sitting, Cuff Size: Large)   Pulse 64   Ht 5' 4.5" (1.638 m)   Wt 195 lb (88.5 kg)   LMP 11/30/2004 (Approximate)   BMI 32.95 kg/m  Height: 5' 4.5" (163.8 cm) Ht Readings from Last 3 Encounters:  04/12/17 5' 4.5" (1.638 m)  11/10/16 5\' 4"  (1.626 m)  10/01/16 5\' 4"  (1.626 m)    General appearance: alert, cooperative and appears stated age Head: Normocephalic, without obvious abnormality, atraumatic Neck: no adenopathy, supple, symmetrical, trachea midline and thyroid normal to inspection and palpation Lungs: clear to auscultation  bilaterally Breasts: normal appearance, no masses or tenderness, on the left.  On the right is surgical and radiation changes without new mass.   Heart: regular rate and rhythm Abdomen: soft, non-tender; no masses,  no organomegaly Extremities: extremities normal, atraumatic, no cyanosis or edema Skin: Skin color, texture, turgor normal. No rashes or lesions Lymph nodes: Cervical, supraclavicular, and axillary nodes normal. No abnormal inguinal nodes palpated Neurologic: Grossly normal   Pelvic: External genitalia:  no lesions or yeast at this time              Urethra:  normal appearing urethra with no masses, tenderness or lesions              Bartholin's and Skene's: normal                 Vagina: normal appearing vagina with normal color and discharge, no lesions              Cervix: anteverted              Pap taken: No. Bimanual Exam:  Uterus:  normal size, contour, position, consistency, mobility, non-tender              Adnexa: no mass, fullness, tenderness               Rectovaginal: Confirms               Anus:  normal sphincter tone, no lesions  Chaperone present: yes  A:  Well Woman with normal exam   Postmenopausal on HRT until 4/16             S/P right breast cancer with lumpectomy 04/19/15 treated with radiation SUI doing well on Vesicare History of Vit D deficiency History of GERD    P:   Reviewed health and wellness pertinent to exam  Pap smear: no  Mammogram is scheduled for today  Refill on Vesicare, Protonix and Vit D   Will follow with labs  IFOB is given  Counseled with exercise, diet, calcium, etc.  return annually or prn  An After Visit Summary was printed and given to the patient.

## 2017-04-12 NOTE — Patient Instructions (Addendum)

## 2017-04-13 ENCOUNTER — Telehealth: Payer: Self-pay | Admitting: *Deleted

## 2017-04-13 LAB — LIPID PANEL
Cholesterol: 213 mg/dL — ABNORMAL HIGH (ref <200)
HDL: 60 mg/dL (ref >50)
LDL CALC: 139 mg/dL — AB (ref <100)
Total CHOL/HDL Ratio: 3.6 Ratio (ref <5.0)
Triglycerides: 71 mg/dL (ref <150)
VLDL: 14 mg/dL (ref <30)

## 2017-04-13 LAB — VITAMIN D 25 HYDROXY (VIT D DEFICIENCY, FRACTURES): Vit D, 25-Hydroxy: 31 ng/mL (ref 30–100)

## 2017-04-13 NOTE — Progress Notes (Signed)
Encounter reviewed Jill Jertson, MD   

## 2017-04-13 NOTE — Telephone Encounter (Signed)
-----   Message from Kem Boroughs, [place] sent at 04/13/2017  7:59 AM EDT ----- Please let pt know that Vit D is at 31 compared to last year at 37.6.  She needs to continue with RX Vit D weekly.  The lipid panel shows an elevated total cholesterol of 213 and LDL of 139. Needs to be on a low cholesterol diet.  The TSH is normal.  Does she desire to see a nutritionist?

## 2017-04-13 NOTE — Telephone Encounter (Signed)
I have attempted to contact this patient by phone with the following results: left message to return call to [place] at [phone] answering machine (home per [facility]). Advised call was regarding recent labs. [phone] (Home)

## 2017-04-13 NOTE — Telephone Encounter (Signed)
Spoke with patient. Advised request received for PA for vesicare 5mg . Patient states she has only tried vesicare, reports taking since 2015. Advised patient would submit PA through [email or web address], can take up to 72 hours for response, will call when response received. Patient is agreeable.   PA submitted.   Cc: Terence Lux

## 2017-04-14 NOTE — Telephone Encounter (Signed)
Patient going to work and request you leave her a detailed message with results on her voicemail.

## 2017-04-14 NOTE — Telephone Encounter (Signed)
Patient returning your call.

## 2017-04-15 NOTE — Telephone Encounter (Signed)
Left message to call Sharee Pimple at [phone].  PA for Vesicare denied on 04/15/17, requires trial of oxybutynin IR or XR.

## 2017-04-16 NOTE — Telephone Encounter (Signed)
Will wait for her to call back. May close the encounter.

## 2017-04-16 NOTE — Telephone Encounter (Signed)
Spoke with patient, advised PA foe vesicare denied on 04/15/17. Advised patient may pay for medication out of pocket or can try recommended alternative, oxybutynin. Patient states she will have to think about this and return call. Patient verbalizes understanding.  Kem Boroughs, NP -routing FYI.

## 2017-04-16 NOTE — Telephone Encounter (Signed)
Pt notified in result note.  Closing encounter. 

## 2017-04-27 MED FILL — GABAPENTIN 300 MG CAPSULE: 300 | 90 days supply | Qty: 90 | Fill #3

## 2017-04-28 MED FILL — VIT D2 1.25 MG (50,000 UNIT: 1.25 MG | 84 days supply | Qty: 12 | Fill #0

## 2017-05-26 MED FILL — PANTOPRAZOLE SOD DR 20 MG T: 20 | 90 days supply | Qty: 90 | Fill #0

## 2017-05-26 MED FILL — ANASTROZOLE 1 MG TABLET: 1 | 90 days supply | Qty: 90 | Fill #2

## 2017-05-26 MED FILL — VENLAFAXINE HCL ER 37.5 MG: 37.5 | 90 days supply | Qty: 90 | Fill #2

## 2017-05-28 DIAGNOSIS — H524 Presbyopia: Secondary | ICD-10-CM | POA: Diagnosis not present

## 2017-07-06 ENCOUNTER — Telehealth: Payer: Self-pay | Admitting: *Deleted

## 2017-07-06 NOTE — Telephone Encounter (Signed)
I spoke with the patient to discuss IFOB kit given at annual exam in May 2018. Patient will still do test and return to our office. Order extended 30 days.  Routing to provider for final review. Closing encounter.

## 2017-07-06 NOTE — Telephone Encounter (Signed)
-----   Message from Regina Eck, CNM sent at 06/21/2017  9:32 AM EDT ----- Regarding: IFOB Please call patient and remind to complete IFOB . She has never had colonoscopy

## 2017-07-21 DIAGNOSIS — I972 Postmastectomy lymphedema syndrome: Secondary | ICD-10-CM | POA: Diagnosis not present

## 2017-07-21 DIAGNOSIS — M25531 Pain in right wrist: Secondary | ICD-10-CM | POA: Diagnosis not present

## 2017-08-12 ENCOUNTER — Encounter: Payer: Self-pay | Admitting: Certified Nurse Midwife

## 2017-08-18 ENCOUNTER — Other Ambulatory Visit: Payer: Self-pay | Admitting: Oncology

## 2017-08-18 MED FILL — GABAPENTIN 300 MG CAPSULE: 300 | 90 days supply | Qty: 90 | Fill #0

## 2017-08-18 MED FILL — VENLAFAXINE HCL ER 37.5 MG: 37.5 | 90 days supply | Qty: 90 | Fill #3

## 2017-08-18 MED FILL — ANASTROZOLE 1 MG TABLET: 1 | 90 days supply | Qty: 90 | Fill #3

## 2017-09-03 ENCOUNTER — Other Ambulatory Visit: Payer: Self-pay | Admitting: *Deleted

## 2017-09-03 DIAGNOSIS — Z170 Estrogen receptor positive status [ER+]: Principal | ICD-10-CM

## 2017-09-03 DIAGNOSIS — C50411 Malignant neoplasm of upper-outer quadrant of right female breast: Secondary | ICD-10-CM

## 2017-09-06 ENCOUNTER — Other Ambulatory Visit (HOSPITAL_BASED_OUTPATIENT_CLINIC_OR_DEPARTMENT_OTHER): Payer: 59

## 2017-09-06 DIAGNOSIS — C50411 Malignant neoplasm of upper-outer quadrant of right female breast: Secondary | ICD-10-CM | POA: Diagnosis not present

## 2017-09-06 DIAGNOSIS — Z170 Estrogen receptor positive status [ER+]: Principal | ICD-10-CM

## 2017-09-06 LAB — COMPREHENSIVE METABOLIC PANEL
ALBUMIN: 3.9 g/dL (ref 3.5–5.0)
ALK PHOS: 111 U/L (ref 40–150)
ALT: 11 U/L (ref 0–55)
AST: 19 U/L (ref 5–34)
Anion Gap: 7 mEq/L (ref 3–11)
BUN: 9.7 mg/dL (ref 7.0–26.0)
CO2: 27 mEq/L (ref 22–29)
Calcium: 9.5 mg/dL (ref 8.4–10.4)
Chloride: 107 mEq/L (ref 98–109)
Creatinine: 0.8 mg/dL (ref 0.6–1.1)
EGFR: 73 mL/min/{1.73_m2} — AB (ref >90)
Glucose: 97 mg/dl (ref 70–140)
POTASSIUM: 3.8 meq/L (ref 3.5–5.1)
SODIUM: 141 meq/L (ref 136–145)
Total Bilirubin: 0.67 mg/dL (ref 0.20–1.20)
Total Protein: 7.7 g/dL (ref 6.4–8.3)

## 2017-09-06 LAB — CBC WITH DIFFERENTIAL/PLATELET
BASO%: 0.4 % (ref 0.0–2.0)
BASOS ABS: 0 10*3/uL (ref 0.0–0.1)
EOS ABS: 0.1 10*3/uL (ref 0.0–0.5)
EOS%: 2 % (ref 0.0–7.0)
HCT: 40.9 % (ref 34.8–46.6)
HEMOGLOBIN: 13.1 g/dL (ref 11.6–15.9)
LYMPH%: 33.2 % (ref 14.0–49.7)
MCH: 28.2 pg (ref 25.1–34.0)
MCHC: 32 g/dL (ref 31.5–36.0)
MCV: 88 fL (ref 79.5–101.0)
MONO#: 0.4 10*3/uL (ref 0.1–0.9)
MONO%: 6.5 % (ref 0.0–14.0)
NEUT#: 3.2 10*3/uL (ref 1.5–6.5)
NEUT%: 57.9 % (ref 38.4–76.8)
Platelets: 180 10*3/uL (ref 145–400)
RBC: 4.65 10*6/uL (ref 3.70–5.45)
RDW: 14 % (ref 11.2–14.5)
WBC: 5.6 10*3/uL (ref 3.9–10.3)
lymph#: 1.9 10*3/uL (ref 0.9–3.3)

## 2017-09-09 NOTE — Progress Notes (Signed)
[facility]  [phone] Fax:([phone]) [phone]     ID: [name] DOB: 1951/01/10  MR#: [id number]  XKG#:[id number]  Patient Care Team: Briscoe Deutscher, MD as PCP - General (Family Medicine) Kem Boroughs, [place] as Nurse Practitioner (Nurse Practitioner) Jovita Kussmaul, MD as Consulting Physician (General Surgery) Magrinat, Virgie Dad, MD as Consulting Physician (Oncology) Eppie Gibson, MD as Attending Physician (Radiation Oncology) Rockwell Germany, RN as Registered Nurse Mauro Kaufmann, RN as Registered Nurse Holley Bouche, NP as Nurse Practitioner (Nurse Practitioner) Sylvan Cheese, NP as Nurse Practitioner (Hematology and Oncology) OTHER MD:  CHIEF COMPLAINT: Estrogen receptor positive breast cancer  CURRENT TREATMENT:  Anastrozole   BREAST CANCER HISTORY: From the original intake note:  Deborah Mercado had routine bilateral screening mammography at the breast Center for 20 04/19/2015. This showed the breast density to be category B. A possible mass in the right breast with 2 diagnostic right mammography with tomosynthesis and right breast ultrasonography 04/01/2015. There was a small spiculated mass in the central right breast at approximately 11:30 o'clock. There was no palpable abnormality by exam. Ultrasound confirmed an irregularly marginated hypoechoic mass measuring 6 mm in the area in question. Ultrasound of the right axilla was unremarkable.  Biopsy of the mass in question was obtained 04/01/2015 and showed ([id number]) and invasive ductal carcinoma, grade 1, estrogen receptor 75% positive, progesterone receptor 90% positive, both with strong staining intensity, with an MIB-1 of 5%, and no HER-2 amplification, the signals ratio being 1.79 and the number per cell 2.60.  The patient's subsequent history Mercado as detailed below  INTERVAL HISTORY: Deborah Mercado returns today for follow-up and treatment of her estrogen receptor positive breast  cancer. She continues on anastrozole, with gradually improving hot flashes and intermittent vaginal dryness. She notes that she Mercado doing well overall.    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 She also attends bird shows with her birds. She denies unusual headaches, visual changes, nausea, vomiting, or dizziness. There has been no unusual cough, phlegm production, or pleurisy. This been no change in bowel or bladder habits. She denies unexplained fatigue or unexplained weight loss, bleeding, rash, or fever. A detailed review of systems was otherwise stable.    PAST MEDICAL HISTORY: Past Medical History:  Diagnosis Date  . Allergy   . Breast cancer ([place]) 04/01/15   Right breast Invasive ductl carcinoma  . GERD (gastroesophageal reflux disease)   . Personal history of radiation therapy 2016  . SUI (stress urinary incontinence, female)   . Vitamin D deficiency     PAST SURGICAL HISTORY: Past Surgical History:  Procedure Laterality Date  . BREAST BIOPSY Right 04/01/2015   malignant  . BREAST LUMPECTOMY Right 04/19/2015  . BREAST LUMPECTOMY WITH RADIOACTIVE SEED AND SENTINEL LYMPH NODE BIOPSY Right 04/19/2015   Procedure: BREAST LUMPECTOMY WITH RADIOACTIVE SEED AND SENTINEL LYMPH NODE MAPPING;  Surgeon: Autumn Messing III, MD;  Location: [facility];  Service: General;  Laterality: Right;    FAMILY HISTORY Family History  Problem Relation Age of Onset  . Heart disease Mother   . Rheum arthritis Mother   . Hypertension Mother   . Rheum arthritis Maternal Grandmother   . Heart attack Maternal Grandmother   . Heart disease Maternal Grandmother   . Breast cancer Cousin  the patient's parents are still living, in their mid 6s. The patient had no brothers, one sister. There Mercado no history of breast or ovarian  cancer in the family to her knowledge.  GYNECOLOGIC HISTORY:  Patient's last menstrual period was 11/30/2004 (approximate). Menarche age 72, first live birth age 29. The patient Mercado GX P1. She has been on hormone replacement until this diagnosis, with her last dose dose 04/05/2015.  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 She Mercado divorced and lives I herself with a Hospital doctor and approximately 48 birds. Her daughter Deborah Mercado lives in [place] where she Mercado supply Electronics engineer for a [employer].    ADVANCED DIRECTIVES: Not in place. At the 05/11/2016 visit the patient was given the appropriate forms to complete and notarize at her discretion.   HEALTH MAINTENANCE: Social History  Substance Use Topics  . Smoking status: Never Smoker  . Smokeless tobacco: Never Used  . Alcohol use No     Colonoscopy:  PAP:  Bone density:  Lipid panel:  Allergies  Allergen Reactions  . Influenza Vaccines   . Monistat [Miconazole] Other (See Comments)    Severe burning and pain with use    Current Outpatient Prescriptions  Medication Sig Dispense Refill  . anastrozole (ARIMIDEX) 1 MG tablet Take 1 tablet (1 mg total) by mouth daily. 90 tablet 4  . gabapentin (NEURONTIN) 300 MG capsule Take 1 capsule (300 mg total) by mouth daily. 90 capsule 4  . gabapentin (NEURONTIN) 300 MG capsule TAKE 1 CAPSULE (300 MG TOTAL) BY MOUTH DAILY. 90 capsule 4  . nystatin-triamcinolone (MYCOLOG II) cream Apply 1 application topically 2 (two) times daily. Apply to affected area BID for up to 7 days. 60 g 1  . pantoprazole (PROTONIX) 20 MG tablet Take 1 tablet (20 mg total) by mouth daily. 90 tablet 3  . solifenacin (VESICARE) 5 MG tablet Take 1 tablet (5 mg total) by mouth daily. 90 tablet 3  . venlafaxine XR (EFFEXOR-XR) 37.5 MG 24 hr capsule Take 1 capsule (37.5 mg total) by mouth daily with breakfast. 90 capsule 4  . Vitamin D, Ergocalciferol, (DRISDOL) 50000 units CAPS  capsule Take 1 capsule (50,000 Units total) by mouth every 7 (seven) days. 30 capsule 3   No current facility-administered medications for this visit.     OBJECTIVE: Middle-aged white woman In no acute distress Vitals:   09/13/17 1153  BP: 129/70  Pulse: 78  Resp: 20  Temp: 98.3 F (36.8 C)  SpO2: 96%     Body mass index Mercado 33.34 kg/m.    ECOG FS:0 - Asymptomatic  Sclerae unicteric, EOMs intact Oropharynx clear and moist No cervical or supraclavicular adenopathy Lungs no rales or rhonchi Heart regular rate and rhythm Abd soft, nontender, positive bowel sounds MSK no focal spinal tenderness, no upper extremity lymphedema Neuro: nonfocal, well oriented, appropriate affect Breasts: The right breast Mercado status post mastectomy and radiation with no evidence of local recurrence. The left breast Mercado benign. Both axillae are benign.   LAB RESULTS:  CMP     Component Value Date/Time   NA 141 09/06/2017 1136   K 3.8 09/06/2017 1136   CL 108 03/18/2015 1454   CO2 27 09/06/2017 1136   GLUCOSE 97 09/06/2017 1136   BUN 9.7 09/06/2017 1136   CREATININE 0.8 09/06/2017 1136   CALCIUM 9.5 09/06/2017 1136   PROT 7.7 09/06/2017 1136   ALBUMIN 3.9 09/06/2017 1136   AST 19 09/06/2017  1136   ALT 11 09/06/2017 1136   ALKPHOS 111 09/06/2017 1136   BILITOT 0.67 09/06/2017 1136    INo results found for: SPEP, UPEP  Lab Results  Component Value Date   WBC 5.6 09/06/2017   NEUTROABS 3.2 09/06/2017   HGB 13.1 09/06/2017   HCT 40.9 09/06/2017   MCV 88.0 09/06/2017   PLT 180 09/06/2017      Chemistry      Component Value Date/Time   NA 141 09/06/2017 1136   K 3.8 09/06/2017 1136   CL 108 03/18/2015 1454   CO2 27 09/06/2017 1136   BUN 9.7 09/06/2017 1136   CREATININE 0.8 09/06/2017 1136      Component Value Date/Time   CALCIUM 9.5 09/06/2017 1136   ALKPHOS 111 09/06/2017 1136   AST 19 09/06/2017 1136   ALT 11 09/06/2017 1136   BILITOT 0.67 09/06/2017 1136       No  results found for: LABCA2  No components found for: LABCA125  No results for input(s): INR in the last 168 hours.  Urinalysis    Component Value Date/Time   BILIRUBINUR n 10/01/2016 1256   PROTEINUR n 10/01/2016 1256   UROBILINOGEN negative 10/01/2016 1256   NITRITE n 10/01/2016 1256   LEUKOCYTESUR Negative 10/01/2016 1256    STUDIES: diagnostic mammogram with tomography completed at [facility] on 04/12/2017 that showed: Breast density B. There were stable post lumpectomy changes of the right breast.   ASSESSMENT: 66 y.o. [place], [place] woman status post right breast upper outer quadrant biopsy 04/01/2015 for a clinical T1b N0, stage IA invasive ductal carcinoma, grade 1, estrogen and progesterone receptor positive, HER-2 not amplified, with an MIB-1 of 5%  (1) status post right lumpectomy and sentinel lymph node sampling 04/19/2015 for a pT1c pN0, stage IA invasive ductal carcinoma, grade 1, with negative margins  (2) Oncotype DX score of 13 predicts a 10 year risk of outside the breast recurrence of 9% if the patient's only systemic therapy Mercado tamoxifen for 5 years. It also predicts no benefit from chemotherapy  (3) adjuvant radiation 06/11/2015-07/22/2015: 1)Right breast / 50 Gy in 25 fractions 2) Right breast boost / 10 Gy in 5 fractions  (4) started anastrozole 08/31/2015  (a) bone density at [facility] 04/06/2016 shows a T score of - 1.1  PLAN: Deborah Mercado now 2 years out from definitive surgery for her breast cancer with no evidence of disease recurrence. This Mercado very favorable.  She continues on anastrozole with good tolerance. The plan Mercado to continue that for a total of 5 years.  I think she might benefit from a right-sided compression sleeve. We discussed that at length today. I went ahead and wrote him a prescription for her to use at her discretion  Otherwise she will return to see me in one year  She knows to call for any problems that may develop before  that visit.  Magrinat, Virgie Dad, MD  09/13/17 12:05 PM  Medical Oncology and Hematology [facility] [address] [place], [place] [address] Tel. [phone]    Fax. [phone]   This document serves as a record of services personally performed by Lurline Del, MD. It was created on her behalf by Steva Colder, a trained medical scribe. The creation of this record Mercado based on the scribe's personal observations and the provider's statements to them. This document has been checked and approved by the attending provider.

## 2017-09-13 ENCOUNTER — Ambulatory Visit (HOSPITAL_BASED_OUTPATIENT_CLINIC_OR_DEPARTMENT_OTHER): Payer: 59 | Admitting: Oncology

## 2017-09-13 VITALS — BP 129/70 | HR 78 | Temp 98.3°F | Resp 20 | Ht 64.5 in | Wt 197.3 lb

## 2017-09-13 DIAGNOSIS — C50411 Malignant neoplasm of upper-outer quadrant of right female breast: Secondary | ICD-10-CM

## 2017-09-13 DIAGNOSIS — Z170 Estrogen receptor positive status [ER+]: Secondary | ICD-10-CM | POA: Diagnosis not present

## 2017-09-13 DIAGNOSIS — N898 Other specified noninflammatory disorders of vagina: Secondary | ICD-10-CM

## 2017-09-13 DIAGNOSIS — N951 Menopausal and female climacteric states: Secondary | ICD-10-CM | POA: Diagnosis not present

## 2017-09-13 MED ORDER — GABAPENTIN 300 MG PO CAPS: 300.0000 mg | ORAL_CAPSULE | Freq: Every day | ORAL | 4 refills | Status: DC

## 2017-09-13 MED ORDER — ANASTROZOLE 1 MG PO TABS: 1.0000 mg | ORAL_TABLET | Freq: Every day | ORAL | 4 refills | Status: DC

## 2017-09-14 ENCOUNTER — Telehealth: Payer: Self-pay | Admitting: Oncology

## 2017-09-14 NOTE — Telephone Encounter (Signed)
Scheduled appt per 10/15 los - lab and f/u in one year - reminder letter sent in the mail.

## 2017-11-16 ENCOUNTER — Other Ambulatory Visit: Payer: Self-pay | Admitting: Oncology

## 2017-11-16 MED FILL — ANASTROZOLE 1 MG TABLET: 1 | 90 days supply | Qty: 90 | Fill #0

## 2017-11-16 MED FILL — VENLAFAXINE HCL ER 37.5 MG: 37.5 | 90 days supply | Qty: 90 | Fill #4

## 2017-11-16 MED FILL — PANTOPRAZOLE SOD DR 20 MG T: 20 | 90 days supply | Qty: 90 | Fill #1

## 2017-11-16 MED FILL — GABAPENTIN 300 MG CAPSULE: 300 | 90 days supply | Qty: 90 | Fill #1

## 2018-02-18 ENCOUNTER — Other Ambulatory Visit: Payer: Self-pay | Admitting: Oncology

## 2018-02-18 MED FILL — VENLAFAXINE HCL ER 37.5 MG: 37.5 | 90 days supply | Qty: 90 | Fill #0

## 2018-02-18 MED FILL — ANASTROZOLE 1 MG TABLET: 1 | 90 days supply | Qty: 90 | Fill #1

## 2018-02-18 MED FILL — PANTOPRAZOLE SOD DR 20 MG T: 20 | 90 days supply | Qty: 90 | Fill #2

## 2018-03-03 ENCOUNTER — Other Ambulatory Visit: Payer: Self-pay | Admitting: Oncology

## 2018-03-03 DIAGNOSIS — Z9889 Other specified postprocedural states: Secondary | ICD-10-CM

## 2018-03-08 MED FILL — GABAPENTIN 300 MG CAPSULE: 300 | 90 days supply | Qty: 90 | Fill #2

## 2018-04-14 ENCOUNTER — Ambulatory Visit
Admission: RE | Admit: 2018-04-14 | Discharge: 2018-04-14 | Disposition: A | Discharge status: Home / Self Care | Payer: 59 | Source: Ambulatory Visit | Attending: Oncology | Admitting: Oncology

## 2018-04-14 DIAGNOSIS — Z9889 Other specified postprocedural states: Secondary | ICD-10-CM

## 2018-04-14 DIAGNOSIS — R922 Inconclusive mammogram: Secondary | ICD-10-CM | POA: Diagnosis not present

## 2018-05-20 ENCOUNTER — Other Ambulatory Visit: Payer: Self-pay

## 2018-05-20 ENCOUNTER — Telehealth: Payer: Self-pay | Admitting: *Deleted

## 2018-05-20 ENCOUNTER — Ambulatory Visit (INDEPENDENT_AMBULATORY_CARE_PROVIDER_SITE_OTHER): Payer: 59 | Admitting: Obstetrics and Gynecology

## 2018-05-20 ENCOUNTER — Encounter: Payer: Self-pay | Admitting: Obstetrics and Gynecology

## 2018-05-20 VITALS — BP 130/80 | HR 80 | Resp 16 | Ht 64.75 in | Wt 193.0 lb

## 2018-05-20 DIAGNOSIS — E559 Vitamin D deficiency, unspecified: Secondary | ICD-10-CM | POA: Diagnosis not present

## 2018-05-20 DIAGNOSIS — Z01419 Encounter for gynecological examination (general) (routine) without abnormal findings: Secondary | ICD-10-CM

## 2018-05-20 MED ORDER — SOLIFENACIN SUCCINATE 5 MG PO TABS: 5.0000 mg | ORAL_TABLET | Freq: Every day | ORAL | 3 refills | Status: DC

## 2018-05-20 MED ORDER — PANTOPRAZOLE SODIUM 20 MG PO TBEC: 20.0000 mg | DELAYED_RELEASE_TABLET | Freq: Every day | ORAL | 0 refills | Status: DC

## 2018-05-20 MED FILL — VENLAFAXINE HCL ER 37.5 MG: 37.5 | 90 days supply | Qty: 90 | Fill #1

## 2018-05-20 MED FILL — ANASTROZOLE 1 MG TABLET: 1 | 90 days supply | Qty: 90 | Fill #2

## 2018-05-20 NOTE — Patient Instructions (Signed)

## 2018-05-20 NOTE — Progress Notes (Signed)
67 y.o. G16P1001 Divorced Caucasian female here for annual exam.    Needs refill of vit D, Protonix, and Vesicare.  On Protonix for about 5 years.  States she has indigestion if she does not take this.  No prior GY evaluation. Has urinary frequency.  Leaks with cough, sneeze, laugh sometimes.  [place] working well. Drinks a lot of caffeine.  Wears a pad.   Declines colonoscopy.  Declines IFOB today.   No vaginal bleeding.   Has vaginal dryness.  Using Replens.   On Anastrozole for a plan of 5 years. Followed by Dr. Jana Hakim.   Raises birds.   PCP: [facility] - Dr. Dierdre Forth, now retired.   Patient's last menstrual period was 11/30/2004 (approximate).           Sexually active: No.  The current method of family planning is post menopausal status.    Exercising: No.  The patient does not participate in regular exercise at present. Smoker:  no  Health Maintenance: Pap:  04/06/16, Negative with neg HR HPV             12/02/12, Negative with neg HR HPV History of abnormal Pap:  no MMG:  04/14/18 BIRADS 2 benign/density c Colonoscopy:  Never  BMD:   04/06/16  Result  T Score: -0.9 Spine / -1.1 Right Femur Neck / -1.0 Left Femur Neck TDaP:  2009 Gardasil:   n/a HIV and Hep C: 04/06/16 Negative Screening Labs:  Discuss today   reports that she has never smoked. She has never used smokeless tobacco. She reports that she does not drink alcohol or use drugs.  Past Medical History:  Diagnosis Date  . Allergy   . Breast cancer ([place]) 04/01/15   Right breast Invasive ductl carcinoma  . GERD (gastroesophageal reflux disease)   . Personal history of radiation therapy 2016  . SUI (stress urinary incontinence, female)   . Vitamin D deficiency     Past Surgical History:  Procedure Laterality Date  . BREAST BIOPSY Right 04/01/2015   malignant  . BREAST LUMPECTOMY Right 04/19/2015  . BREAST LUMPECTOMY WITH RADIOACTIVE SEED AND SENTINEL LYMPH NODE BIOPSY Right  04/19/2015   Procedure: BREAST LUMPECTOMY WITH RADIOACTIVE SEED AND SENTINEL LYMPH NODE MAPPING;  Surgeon: Autumn Messing III, MD;  Location: [facility];  Service: General;  Laterality: Right;    Current Outpatient Medications  Medication Sig Dispense Refill  . anastrozole (ARIMIDEX) 1 MG tablet Take 1 tablet (1 mg total) by mouth daily. 90 tablet 4  . gabapentin (NEURONTIN) 300 MG capsule Take 1 capsule (300 mg total) by mouth daily. 90 capsule 4  . pantoprazole (PROTONIX) 20 MG tablet Take 1 tablet (20 mg total) by mouth daily. 90 tablet 3  . solifenacin (VESICARE) 5 MG tablet Take 1 tablet (5 mg total) by mouth daily. 90 tablet 3  . venlafaxine XR (EFFEXOR-XR) 37.5 MG 24 hr capsule Take 1 capsule (37.5 mg total) by mouth daily with breakfast. 90 capsule 4  . venlafaxine XR (EFFEXOR-XR) 37.5 MG 24 hr capsule TAKE 1 CAPSULE BY MOUTH DAILY WITH BREAKFAST. 90 capsule 4  . Vitamin D, Ergocalciferol, (DRISDOL) 50000 units CAPS capsule Take 1 capsule (50,000 Units total) by mouth every 7 (seven) days. 30 capsule 3   No current facility-administered medications for this visit.     Family History  Problem Relation Age of Onset  . Heart disease Mother   . Rheum arthritis Mother   . Hypertension Mother   .  Rheum arthritis Maternal Grandmother   . Heart attack Maternal Grandmother   . Heart disease Maternal Grandmother   . Breast cancer Cousin     Review of Systems  Constitutional: Negative.   HENT: Negative.   Eyes: Negative.   Respiratory: Negative.   Cardiovascular: Negative.   Gastrointestinal: Negative.   Endocrine: Negative.   Genitourinary: Negative.   Musculoskeletal: Negative.   Skin: Negative.   Allergic/Immunologic: Negative.   Neurological: Negative.   Hematological: Negative.   Psychiatric/Behavioral: Negative.     Exam:   BP 130/80 (BP Location: Left Arm, Patient Position: Sitting, Cuff Size: Normal)   Pulse 80   Resp 16   Ht 5' 4.75" (1.645 m)   Wt 193  lb (87.5 kg)   LMP 11/30/2004 (Approximate)   BMI 32.37 kg/m     General appearance: alert, cooperative and appears stated age Head: Normocephalic, without obvious abnormality, atraumatic Neck: no adenopathy, supple, symmetrical, trachea midline and thyroid normal to inspection and palpation Lungs: clear to auscultation bilaterally Breasts: normal appearance, no masses or tenderness, No nipple retraction or dimpling, No nipple discharge or bleeding, No axillary or supraclavicular adenopathy Heart: regular rate and rhythm Abdomen: soft, non-tender; no masses, no organomegaly Extremities: extremities normal, atraumatic, no cyanosis or edema Skin: Skin color, texture, turgor normal. No rashes or lesions Lymph nodes: Cervical, supraclavicular, and axillary nodes normal. No abnormal inguinal nodes palpated Neurologic: Grossly normal  Pelvic: External genitalia:  no lesions              Urethra:  normal appearing urethra with no masses, tenderness or lesions              Bartholins and Skenes: normal                 Vagina: normal appearing vagina with normal color and discharge, no lesions              Cervix: no lesions              Pap taken: No. Bimanual Exam:  Uterus:  normal size, contour, position, consistency, mobility, non-tender              Adnexa: no mass, fullness, tenderness              Rectal exam: Yes.  .  Confirms.              Anus:  normal sphincter tone, no lesions  Chaperone was present for exam.  Assessment:   Well woman visit with normal exam. Hx right breast cancer.  Urinary incontinence.  On Vesicare.  Dyspepsia.  Long term Protonix use.   Plan: Mammogram screening. Recommended self breast awareness. Pap and HR HPV as above. Guidelines for Calcium, Vitamin D, regular exercise program including cardiovascular and weight bearing exercise. I really recommend she she GI for evaluation of dyspepsia and get her colonoscopy done.  She declines.  I will refill  her Protonix for 90 days until she can see her PCP for continuation of this prescription if appropriate.  We talked about stomach irritants, of which caffeine is one.  Refill of Vesicare 5 mg daily.  Check vit D and then determine if needs prescription vit D or OTC vit D.  We talked about the guidelines for replacement.  Follow up annually and prn.   After visit summary provided.

## 2018-05-20 NOTE — Telephone Encounter (Signed)
PA request received from [facility] for Vesicare 5mg  tab.   PA submitted via [email or web address]. Key [id number]  Call to patient, left detailed message, ok per dpr. Advised PA request submitted for Vesicare, can take up to 72 buisness hrs for decision. Will notify of response once received. Return call to office if any additional questions.

## 2018-05-21 LAB — VITAMIN D 25 HYDROXY (VIT D DEFICIENCY, FRACTURES): Vit D, 25-Hydroxy: 16.5 ng/mL — ABNORMAL LOW (ref 30.0–100.0)

## 2018-05-23 ENCOUNTER — Other Ambulatory Visit: Payer: Self-pay | Admitting: *Deleted

## 2018-05-23 MED ORDER — VITAMIN D (ERGOCALCIFEROL) 1.25 MG (50000 UNIT) PO CAPS: 50000.0000 [IU] | ORAL_CAPSULE | ORAL | 0 refills | Status: DC

## 2018-05-23 MED FILL — VIT D2 1.25 MG (50,000 UNIT: 1.25 MG | 84 days supply | Qty: 12 | Fill #0

## 2018-05-23 NOTE — Telephone Encounter (Signed)
Spoke with patient regarding lab results dated 05/20/18. Advised of PA as seen below. Will notify of response once received. Patient verbalizes understanding.

## 2018-05-23 NOTE — Addendum Note (Signed)
Addended by: Yisroel Ramming, Dietrich Pates E on: 05/23/2018 08:33 AM   Modules accepted: Orders

## 2018-05-25 MED FILL — PANTOPRAZOLE SOD DR 20 MG T: 20 | 90 days supply | Qty: 90 | Fill #0

## 2018-05-25 NOTE — Telephone Encounter (Signed)
Fax received from [facility], additional info required to complete PA for [employer]. Form completed and faxed back to [employer].

## 2018-05-30 NOTE — Telephone Encounter (Signed)
Left message to call Sharee Pimple at [phone].   PA case ID # [id number] PA denied on 05/27/18 for Vesicare. Requires trial of oxybutynin IR or ER

## 2018-05-31 NOTE — Telephone Encounter (Signed)
Spoke with patient, notified of PA response. Patient declines alternative Rx at this time, will think about it and return call if she would like to proceed. Patient verbalizes understanding.   Routing to provider for final review. Patient is agreeable to disposition. Will close encounter.

## 2018-06-15 MED FILL — GABAPENTIN 300 MG CAPSULE: 300 | 90 days supply | Qty: 90 | Fill #3

## 2018-08-05 DIAGNOSIS — H524 Presbyopia: Secondary | ICD-10-CM | POA: Diagnosis not present

## 2018-08-17 MED FILL — VENLAFAXINE HCL ER 37.5 MG: 37.5 | 90 days supply | Qty: 90 | Fill #2

## 2018-08-17 MED FILL — ANASTROZOLE 1 MG TABLET: 1 | 90 days supply | Qty: 90 | Fill #3

## 2018-08-28 ENCOUNTER — Telehealth: Payer: Self-pay | Admitting: Obstetrics and Gynecology

## 2018-08-28 DIAGNOSIS — E559 Vitamin D deficiency, unspecified: Secondary | ICD-10-CM

## 2018-08-28 NOTE — Telephone Encounter (Signed)
Please contact patient and let her know that she is due for a vit D recheck lab appointment.  She came up in my Epic labs reminders.

## 2018-08-29 ENCOUNTER — Other Ambulatory Visit (INDEPENDENT_AMBULATORY_CARE_PROVIDER_SITE_OTHER): Payer: 59

## 2018-08-29 DIAGNOSIS — E559 Vitamin D deficiency, unspecified: Secondary | ICD-10-CM | POA: Diagnosis not present

## 2018-08-29 NOTE — Telephone Encounter (Signed)
Call to patient. Patient requesting to come in today for lab appointment. Patient scheduled for Monday 08-29-18 at 1400. Patient agreeable to date and time of appointment. Future order present for lab work.   Routing to provider for final review. Patient agreeable to disposition. Will close encounter.

## 2018-08-29 NOTE — Addendum Note (Signed)
Addended by: Michele Mcalpine on: 08/29/2018 01:34 PM   Modules accepted: Orders

## 2018-08-30 LAB — VITAMIN D 25 HYDROXY (VIT D DEFICIENCY, FRACTURES): VIT D 25 HYDROXY: 34.5 ng/mL (ref 30.0–100.0)

## 2018-09-12 ENCOUNTER — Other Ambulatory Visit: Payer: Self-pay

## 2018-09-12 DIAGNOSIS — C50411 Malignant neoplasm of upper-outer quadrant of right female breast: Secondary | ICD-10-CM

## 2018-09-12 DIAGNOSIS — Z170 Estrogen receptor positive status [ER+]: Principal | ICD-10-CM

## 2018-09-13 ENCOUNTER — Inpatient Hospital Stay: Payer: 59 | Attending: Oncology | Admitting: Oncology

## 2018-09-13 ENCOUNTER — Inpatient Hospital Stay: Payer: 59

## 2018-09-13 ENCOUNTER — Telehealth: Payer: Self-pay | Admitting: Oncology

## 2018-09-13 VITALS — BP 140/76 | HR 81 | Temp 98.6°F | Resp 18 | Ht 64.75 in | Wt 191.4 lb

## 2018-09-13 DIAGNOSIS — N951 Menopausal and female climacteric states: Secondary | ICD-10-CM | POA: Insufficient documentation

## 2018-09-13 DIAGNOSIS — C50411 Malignant neoplasm of upper-outer quadrant of right female breast: Secondary | ICD-10-CM

## 2018-09-13 DIAGNOSIS — R030 Elevated blood-pressure reading, without diagnosis of hypertension: Secondary | ICD-10-CM | POA: Insufficient documentation

## 2018-09-13 DIAGNOSIS — Z170 Estrogen receptor positive status [ER+]: Principal | ICD-10-CM

## 2018-09-13 DIAGNOSIS — N898 Other specified noninflammatory disorders of vagina: Secondary | ICD-10-CM | POA: Insufficient documentation

## 2018-09-13 DIAGNOSIS — I10 Essential (primary) hypertension: Secondary | ICD-10-CM

## 2018-09-13 LAB — CMP (CANCER CENTER ONLY)
ALT: 12 U/L (ref 0–44)
AST: 19 U/L (ref 15–41)
Albumin: 3.8 g/dL (ref 3.5–5.0)
Alkaline Phosphatase: 119 U/L (ref 38–126)
Anion gap: 9 (ref 5–15)
BILIRUBIN TOTAL: 0.7 mg/dL (ref 0.3–1.2)
BUN: 8 mg/dL (ref 8–23)
CO2: 27 mmol/L (ref 22–32)
CREATININE: 0.83 mg/dL (ref 0.44–1.00)
Calcium: 9.5 mg/dL (ref 8.9–10.3)
Chloride: 106 mmol/L (ref 98–111)
Glucose, Bld: 98 mg/dL (ref 70–99)
POTASSIUM: 4 mmol/L (ref 3.5–5.1)
Sodium: 142 mmol/L (ref 135–145)
TOTAL PROTEIN: 7.8 g/dL (ref 6.5–8.1)

## 2018-09-13 LAB — CBC WITH DIFFERENTIAL (CANCER CENTER ONLY)
Abs Immature Granulocytes: 0.02 10*3/uL (ref 0.00–0.07)
BASOS PCT: 1 %
Basophils Absolute: 0.1 10*3/uL (ref 0.0–0.1)
EOS PCT: 4 %
Eosinophils Absolute: 0.2 10*3/uL (ref 0.0–0.5)
HCT: 41.3 % (ref 36.0–46.0)
Hemoglobin: 13.2 g/dL (ref 12.0–15.0)
Immature Granulocytes: 0 %
Lymphocytes Relative: 31 %
Lymphs Abs: 1.9 10*3/uL (ref 0.7–4.0)
MCH: 27.3 pg (ref 26.0–34.0)
MCHC: 32 g/dL (ref 30.0–36.0)
MCV: 85.5 fL (ref 80.0–100.0)
MONO ABS: 0.5 10*3/uL (ref 0.1–1.0)
Monocytes Relative: 7 %
NEUTROS ABS: 3.4 10*3/uL (ref 1.7–7.7)
NRBC: 0 % (ref 0.0–0.2)
Neutrophils Relative %: 57 %
Platelet Count: 212 10*3/uL (ref 150–400)
RBC: 4.83 MIL/uL (ref 3.87–5.11)
RDW: 13.8 % (ref 11.5–15.5)
WBC: 6.1 10*3/uL (ref 4.0–10.5)

## 2018-09-13 MED ORDER — LISINOPRIL 10 MG PO TABS: 10.0000 mg | ORAL_TABLET | Freq: Every day | ORAL | 4 refills | Status: DC

## 2018-09-13 MED ORDER — ANASTROZOLE 1 MG PO TABS: 1.0000 mg | ORAL_TABLET | Freq: Every day | ORAL | 4 refills | Status: DC

## 2018-09-13 MED ORDER — VENLAFAXINE HCL ER 37.5 MG PO CP24: 37.5000 mg | ORAL_CAPSULE | Freq: Every day | ORAL | 4 refills | Status: DC

## 2018-09-13 MED ORDER — GABAPENTIN 300 MG PO CAPS: 300.0000 mg | ORAL_CAPSULE | Freq: Every day | ORAL | 4 refills | Status: DC

## 2018-09-13 MED FILL — GABAPENTIN 300 MG CAPSULE: 300 | 90 days supply | Qty: 90 | Fill #0

## 2018-09-13 MED FILL — LISINOPRIL 10 MG TABLET: 10 | 90 days supply | Qty: 90 | Fill #0

## 2018-09-13 NOTE — Telephone Encounter (Signed)
Gave avs and calendar ° °

## 2018-09-13 NOTE — Progress Notes (Signed)
[facility]  Telephone:([phone] Fax:([phone]     ID: Deborah Mercado DOB: 12/17/1950  MR#: [id number]  JYN#:[id number]  Patient Care Team: Patient, No Pcp Per as PCP - General (General Practice) Ria Comment, FNP as Nurse Practitioner (Nurse Practitioner) Griselda Miner, MD as Consulting Physician (General Surgery) Odester Nilson, Valentino Hue, MD as Consulting Physician (Oncology) Lonie Peak, MD as Attending Physician (Radiation Oncology) Donnelly Angelica, RN as Registered Nurse Pershing Proud, RN as Registered Nurse Hubbard Hartshorn, NP (Inactive) as Nurse Practitioner (Nurse Practitioner) Salomon Fick, NP as Nurse Practitioner (Hematology and Oncology) OTHER MD:  CHIEF COMPLAINT: Estrogen receptor positive breast cancer  CURRENT TREATMENT:  Anastrozole   BREAST CANCER HISTORY: From the original intake note:  Deborah Mercado had routine bilateral screening mammography at the breast Center for 20 04/19/2015. This showed the breast density to be category B. A possible mass in the right breast with 2 diagnostic right mammography with tomosynthesis and right breast ultrasonography 04/01/2015. There was a small spiculated mass in the central right breast at approximately 11:30 o'clock. There was no palpable abnormality by exam. Ultrasound confirmed an irregularly marginated hypoechoic mass measuring 6 mm in the area in question. Ultrasound of the right axilla was unremarkable.  Biopsy of the mass in question was obtained 04/01/2015 and showed ([id number]) and invasive ductal carcinoma, grade 1, estrogen receptor 75% positive, progesterone receptor 90% positive, both with strong staining intensity, with an MIB-1 of 5%, and no HER-2 amplification, the signals ratio being 1.79 and the number per cell 2.60.  The patient's subsequent history is as detailed below  INTERVAL HISTORY: Deborah Mercado returns today for follow-up and treatment of her estrogen receptor  positive breast cancer. She continues on anastrozole, with good tolerance. She has occasional hot flashes that are not intense, and no longer wake her up at night. She takes Neurontin at bedtime. She has some vaginal dryness, and she aids this well coconut oil.   Since her last visit, she underwent diagnostic bilateral mammography with CAD and tomography [facility] showing: breast density category C. There was no evidence of malignancy.     REVIEW OF SYSTEMS: Tymber reports that she is working. 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 She has started walking with the dog and drinking more water. She denies unusual headaches, visual changes, nausea, vomiting, or dizziness. There has been no unusual cough, phlegm production, or pleurisy. There has been no change in bowel or bladder habits. She denies unexplained fatigue or unexplained weight loss, bleeding, rash, or fever. A detailed review of systems was otherwise stable.     PAST MEDICAL HISTORY: Past Medical History:  Diagnosis Date  . Allergy   . Breast cancer (HCC) 04/01/15   Right breast Invasive ductl carcinoma  . GERD (gastroesophageal reflux disease)   . Personal history of radiation therapy 2016  . SUI (stress urinary incontinence, female)   . Vitamin D deficiency     PAST SURGICAL HISTORY: Past Surgical History:  Procedure Laterality Date  . BREAST BIOPSY Right 04/01/2015   malignant  . BREAST LUMPECTOMY Right 04/19/2015  . BREAST LUMPECTOMY WITH RADIOACTIVE SEED AND SENTINEL LYMPH NODE BIOPSY Right 04/19/2015   Procedure: BREAST LUMPECTOMY WITH RADIOACTIVE SEED AND SENTINEL LYMPH NODE MAPPING;  Surgeon: Chevis Pretty III, MD;  Location: [facility];  Service: General;  Laterality: Right;    FAMILY HISTORY Family History  Problem Relation Age of Onset  . Heart disease Mother   .  Rheum arthritis Mother   . Hypertension Mother   . Rheum arthritis Maternal Grandmother   . Heart attack Maternal  Grandmother   . Heart disease Maternal Grandmother   . Breast cancer Cousin    the patient's parents are still living, in their mid 68s. The patient had no brothers, one sister. There is no history of breast or ovarian cancer in the family to her knowledge.  GYNECOLOGIC HISTORY:  Patient's last menstrual period was 11/30/2004 (approximate). Menarche age 57, first live birth age 45. The patient is GX P1. She has been on hormone replacement until this diagnosis, with her last dose dose 04/05/2015.  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 She is divorced and lives I herself with 2 Water quality scientist and approximately 50 birds. Her daughter Deborah Mercado lives in [place] where she is supply Corporate treasurer for a [employer].    ADVANCED DIRECTIVES: Not in place. At the 05/11/2016 visit the patient was given the appropriate forms to complete and notarize at her discretion.   HEALTH MAINTENANCE: Social History   Tobacco Use  . Smoking status: Never Smoker  . Smokeless tobacco: Never Used  Substance Use Topics  . Alcohol use: No  . Drug use: No     Colonoscopy:  PAP:  Bone density:  Lipid panel:  Allergies  Allergen Reactions  . Influenza Vaccines   . Monistat [Miconazole] Other (See Comments)    Severe burning and pain with use    Current Outpatient Medications  Medication Sig Dispense Refill  . anastrozole (ARIMIDEX) 1 MG tablet Take 1 tablet (1 mg total) by mouth daily. 90 tablet 4  . gabapentin (NEURONTIN) 300 MG capsule Take 1 capsule (300 mg total) by mouth daily. 90 capsule 4  . lisinopril (PRINIVIL) 10 MG tablet Take 1 tablet (10 mg total) by mouth daily. 90 tablet 4  . pantoprazole (PROTONIX) 20 MG tablet Take 1 tablet (20 mg total) by mouth daily. 90 tablet 0  . solifenacin (VESICARE) 5 MG tablet Take 1 tablet (5 mg total) by mouth daily. 90 tablet 3  . venlafaxine XR (EFFEXOR-XR) 37.5 MG 24 hr capsule Take 1 capsule (37.5 mg total) by mouth  daily with breakfast. 90 capsule 4  . venlafaxine XR (EFFEXOR-XR) 37.5 MG 24 hr capsule TAKE 1 CAPSULE BY MOUTH DAILY WITH BREAKFAST. 90 capsule 4  . Vitamin D, Ergocalciferol, (DRISDOL) 50000 units CAPS capsule Take 1 capsule (50,000 Units total) by mouth every 7 (seven) days. 12 capsule 0   No current facility-administered medications for this visit.     OBJECTIVE: Middle-aged white woman who appears well  Vitals:   09/13/18 1417  BP: 140/76  Pulse: 81  Resp: 18  Temp: 98.6 F (37 C)  SpO2: 96%     Body mass index is 32.1 kg/m.    ECOG FS:0 - Asymptomatic  Sclerae unicteric, pupils round and equal Oropharynx clear and moist No cervical or supraclavicular adenopathy Lungs no rales or rhonchi Heart regular rate and rhythm Abd soft, nontender, positive bowel sounds MSK no focal spinal tenderness, no upper extremity lymphedema Neuro: nonfocal, well oriented, appropriate affect Breasts: The right breast has undergone mastectomy followed by radiation.  There is no evidence of local recurrence.  The left breast is unremarkable.  Both axillae are benign.  LAB RESULTS:  CMP     Component Value Date/Time   NA 142 09/13/2018 1338   NA 141 09/06/2017 1136   K 4.0 09/13/2018 1338  K 3.8 09/06/2017 1136   CL 106 09/13/2018 1338   CO2 27 09/13/2018 1338   CO2 27 09/06/2017 1136   GLUCOSE 98 09/13/2018 1338   GLUCOSE 97 09/06/2017 1136   BUN 8 09/13/2018 1338   BUN 9.7 09/06/2017 1136   CREATININE 0.83 09/13/2018 1338   CREATININE 0.8 09/06/2017 1136   CALCIUM 9.5 09/13/2018 1338   CALCIUM 9.5 09/06/2017 1136   PROT 7.8 09/13/2018 1338   PROT 7.7 09/06/2017 1136   ALBUMIN 3.8 09/13/2018 1338   ALBUMIN 3.9 09/06/2017 1136   AST 19 09/13/2018 1338   AST 19 09/06/2017 1136   ALT 12 09/13/2018 1338   ALT 11 09/06/2017 1136   ALKPHOS 119 09/13/2018 1338   ALKPHOS 111 09/06/2017 1136   BILITOT 0.7 09/13/2018 1338   BILITOT 0.67 09/06/2017 1136   GFRNONAA >60 09/13/2018 1338    GFRAA >60 09/13/2018 1338    INo results found for: SPEP, UPEP  Lab Results  Component Value Date   WBC 6.1 09/13/2018   NEUTROABS 3.4 09/13/2018   HGB 13.2 09/13/2018   HCT 41.3 09/13/2018   MCV 85.5 09/13/2018   PLT 212 09/13/2018      Chemistry      Component Value Date/Time   NA 142 09/13/2018 1338   NA 141 09/06/2017 1136   K 4.0 09/13/2018 1338   K 3.8 09/06/2017 1136   CL 106 09/13/2018 1338   CO2 27 09/13/2018 1338   CO2 27 09/06/2017 1136   BUN 8 09/13/2018 1338   BUN 9.7 09/06/2017 1136   CREATININE 0.83 09/13/2018 1338   CREATININE 0.8 09/06/2017 1136      Component Value Date/Time   CALCIUM 9.5 09/13/2018 1338   CALCIUM 9.5 09/06/2017 1136   ALKPHOS 119 09/13/2018 1338   ALKPHOS 111 09/06/2017 1136   AST 19 09/13/2018 1338   AST 19 09/06/2017 1136   ALT 12 09/13/2018 1338   ALT 11 09/06/2017 1136   BILITOT 0.7 09/13/2018 1338   BILITOT 0.67 09/06/2017 1136       No results found for: LABCA2  No components found for: LABCA125  No results for input(s): INR in the last 168 hours.  Urinalysis    Component Value Date/Time   BILIRUBINUR n 10/01/2016 1256   PROTEINUR n 10/01/2016 1256   UROBILINOGEN negative 10/01/2016 1256   NITRITE n 10/01/2016 1256   LEUKOCYTESUR Negative 10/01/2016 1256    STUDIES:  diagnostic bilateral mammography with CAD and tomography on [facility]/14/[facility] showing: breast density category C. There was no evidence of malignancy.    ASSESSMENT: 67 y.o. [place],  woman status post right breast upper outer quadrant biopsy 04/01/2015 for a clinical T1b N0, stage IA invasive ductal carcinoma, grade 1, estrogen and progesterone receptor positive, HER-2 not amplified, with an MIB-1 of 5%  (1) status post right lumpectomy and sentinel lymph node sampling 04/19/2015 for a pT1c pN0, stage IA invasive ductal carcinoma, grade 1, with negative margins  (2) Oncotype DX score of 13 predicts a 10 year risk of  outside the breast recurrence of 9% if the patient's only systemic therapy is tamoxifen for 5 years. It also predicts no benefit from chemotherapy  (3) adjuvant radiation 06/11/2015-07/22/2015: 1)Right breast / 50 Gy in 25 fractions 2) Right breast boost / 10 Gy in 5 fractions  (4) started anastrozole 08/31/2015  (a) bone density at [facility] 04/06/2016 shows a T score of - 1.1  PLAN: Deborah Mercado is now  3-1/2 years out from definitive surgery for breast cancer with no evidence of disease recurrence.  This is very favorable.  She is tolerating anastrozole well and the plan is to continue that for a total of 5 years which will take her through October 2021.  She tells me her blood pressure at home is been running in the 1 50-90 range.  It has been elevated here.  We are going to try lisinopril 5 mg daily.  She has a good understanding of the possible toxicity side effects and complications of this agent and particularly if she develops a cough she will let me know.  She will also monitor her blood pressure at home.  She will be scheduled for a bone density scan with her next mammogram May 2020  She will call with any other issues that may develop before the next visit.  Quinlan Mcfall, Valentino Hue, MD  09/13/18 2:46 PM Medical Oncology and Hematology [facility] [address] [place], [place] [address] Tel. [phone]    Fax. [phone]  Fonnie Birkenhead, am acting as scribe for Lowella Dell MD.  I, Ruthann Cancer MD, have reviewed the above documentation for accuracy and completeness, and I agree with the above.

## 2018-09-27 MED FILL — PENICILLIN VK 500 MG TABLET: 500 | 10 days supply | Qty: 44 | Fill #0

## 2018-11-17 MED FILL — VENLAFAXINE HCL ER 37.5 MG: 37.5 | 90 days supply | Qty: 90 | Fill #3

## 2018-11-17 MED FILL — ANASTROZOLE 1 MG TABLET: 1 | 90 days supply | Qty: 90 | Fill #0

## 2018-12-22 MED FILL — LISINOPRIL 10 MG TABS: 10 | 90 days supply | Qty: 90 | Fill #1

## 2018-12-22 MED FILL — GABAPENTIN 300 MG CAPSULE: 300 | 90 days supply | Qty: 90 | Fill #1

## 2019-02-13 MED FILL — ANASTROZOLE 1 MG TABLET: 1 | 90 days supply | Qty: 90 | Fill #1

## 2019-02-13 MED FILL — VENLAFAXINE HCL ER 37.5 MG: 37.5 | 90 days supply | Qty: 90 | Fill #4

## 2019-03-06 ENCOUNTER — Other Ambulatory Visit: Payer: Self-pay | Admitting: Oncology

## 2019-03-06 DIAGNOSIS — Z1231 Encounter for screening mammogram for malignant neoplasm of breast: Secondary | ICD-10-CM

## 2019-03-21 MED FILL — GABAPENTIN 300 MG CAPSULE: 300 | 90 days supply | Qty: 90 | Fill #0

## 2019-04-05 MED FILL — LISINOPRIL 10 MG TABLET: 10 | 90 days supply | Qty: 90 | Fill #0

## 2019-04-18 ENCOUNTER — Other Ambulatory Visit: Payer: Self-pay | Admitting: Oncology

## 2019-04-18 DIAGNOSIS — Z853 Personal history of malignant neoplasm of breast: Secondary | ICD-10-CM

## 2019-04-20 ENCOUNTER — Ambulatory Visit
Admission: RE | Admit: 2019-04-20 | Discharge: 2019-04-20 | Disposition: A | Discharge status: Home / Self Care | Payer: 59 | Source: Ambulatory Visit | Attending: Oncology | Admitting: Oncology

## 2019-04-20 ENCOUNTER — Other Ambulatory Visit: Payer: Self-pay

## 2019-04-20 DIAGNOSIS — Z853 Personal history of malignant neoplasm of breast: Secondary | ICD-10-CM | POA: Diagnosis not present

## 2019-05-15 ENCOUNTER — Other Ambulatory Visit: Payer: Self-pay | Admitting: Oncology

## 2019-05-15 MED FILL — VENLAFAXINE HCL ER 37.5 MG: 37.5 | 90 days supply | Qty: 90 | Fill #0

## 2019-05-15 MED FILL — ANASTROZOLE 1 MG TABLET: 1 | 90 days supply | Qty: 90 | Fill #2

## 2019-05-23 ENCOUNTER — Telehealth: Payer: Self-pay | Admitting: *Deleted

## 2019-05-23 NOTE — Telephone Encounter (Signed)
This RN spoke with pt per her call stating she received a $300 bill for her recent mammogram 04/20/2019 and was informed it due to " the doctor didn't code it as preventative ".  Deborah Mercado was very frustrated per above because she had been on the phone with 5 different places trying to get the above resolved.  This RN informed her above concern would be forwarded to our "coding " department for further review and follow up.  Note pt was further frustrated that she could not get a definitive answer from this RN regarding why the above was not done appropriately for insurance coverage.  This RN sent an email to our coding revenue department per above issue.

## 2019-06-21 MED FILL — GABAPENTIN 300 MG CAPSULE: 300 | 90 days supply | Qty: 90 | Fill #0

## 2019-06-30 ENCOUNTER — Other Ambulatory Visit: Payer: 59

## 2019-07-06 MED FILL — LISINOPRIL 10 MG TABS: 10 | 90 days supply | Qty: 90 | Fill #0

## 2019-08-14 MED FILL — VENLAFAXINE HCL ER 37.5 MG: 37.5 | 90 days supply | Qty: 90 | Fill #1

## 2019-08-14 MED FILL — ANASTROZOLE 1 MG TABLET: 1 | 90 days supply | Qty: 90 | Fill #3

## 2019-09-13 ENCOUNTER — Other Ambulatory Visit: Payer: Self-pay

## 2019-09-13 DIAGNOSIS — C50411 Malignant neoplasm of upper-outer quadrant of right female breast: Secondary | ICD-10-CM

## 2019-09-14 ENCOUNTER — Other Ambulatory Visit: Payer: Self-pay

## 2019-09-14 ENCOUNTER — Inpatient Hospital Stay: Payer: 59 | Attending: Oncology

## 2019-09-14 ENCOUNTER — Inpatient Hospital Stay (HOSPITAL_BASED_OUTPATIENT_CLINIC_OR_DEPARTMENT_OTHER): Payer: 59 | Admitting: Oncology

## 2019-09-14 VITALS — BP 136/78 | HR 73 | Temp 98.3°F | Resp 17 | Ht 64.75 in | Wt 192.8 lb

## 2019-09-14 DIAGNOSIS — M8580 Other specified disorders of bone density and structure, unspecified site: Secondary | ICD-10-CM | POA: Insufficient documentation

## 2019-09-14 DIAGNOSIS — I10 Essential (primary) hypertension: Secondary | ICD-10-CM | POA: Diagnosis not present

## 2019-09-14 DIAGNOSIS — C50411 Malignant neoplasm of upper-outer quadrant of right female breast: Secondary | ICD-10-CM | POA: Insufficient documentation

## 2019-09-14 DIAGNOSIS — Z170 Estrogen receptor positive status [ER+]: Secondary | ICD-10-CM | POA: Diagnosis not present

## 2019-09-14 LAB — CBC WITH DIFFERENTIAL (CANCER CENTER ONLY)
Abs Immature Granulocytes: 0.01 10*3/uL (ref 0.00–0.07)
Basophils Absolute: 0 10*3/uL (ref 0.0–0.1)
Basophils Relative: 1 %
Eosinophils Absolute: 0.1 10*3/uL (ref 0.0–0.5)
Eosinophils Relative: 2 %
HCT: 41 % (ref 36.0–46.0)
Hemoglobin: 13 g/dL (ref 12.0–15.0)
Immature Granulocytes: 0 %
Lymphocytes Relative: 29 %
Lymphs Abs: 1.7 10*3/uL (ref 0.7–4.0)
MCH: 27.5 pg (ref 26.0–34.0)
MCHC: 31.7 g/dL (ref 30.0–36.0)
MCV: 86.7 fL (ref 80.0–100.0)
Monocytes Absolute: 0.4 10*3/uL (ref 0.1–1.0)
Monocytes Relative: 7 %
Neutro Abs: 3.5 10*3/uL (ref 1.7–7.7)
Neutrophils Relative %: 61 %
Platelet Count: 188 10*3/uL (ref 150–400)
RBC: 4.73 MIL/uL (ref 3.87–5.11)
RDW: 13.3 % (ref 11.5–15.5)
WBC Count: 5.8 10*3/uL (ref 4.0–10.5)
nRBC: 0 % (ref 0.0–0.2)

## 2019-09-14 LAB — CMP (CANCER CENTER ONLY)
ALT: 13 U/L (ref 0–44)
AST: 17 U/L (ref 15–41)
Albumin: 3.9 g/dL (ref 3.5–5.0)
Alkaline Phosphatase: 96 U/L (ref 38–126)
Anion gap: 10 (ref 5–15)
BUN: 11 mg/dL (ref 8–23)
CO2: 27 mmol/L (ref 22–32)
Calcium: 9.4 mg/dL (ref 8.9–10.3)
Chloride: 107 mmol/L (ref 98–111)
Creatinine: 0.9 mg/dL (ref 0.44–1.00)
GFR, Est AFR Am: >60 mL/min (ref >60)
GFR, Estimated: >60 mL/min (ref >60)
Glucose, Bld: 95 mg/dL (ref 70–99)
Potassium: 4.7 mmol/L (ref 3.5–5.1)
Sodium: 144 mmol/L (ref 135–145)
Total Bilirubin: 0.6 mg/dL (ref 0.3–1.2)
Total Protein: 7.6 g/dL (ref 6.5–8.1)

## 2019-09-14 MED ORDER — PANTOPRAZOLE SODIUM 20 MG PO TBEC: 20.0000 mg | DELAYED_RELEASE_TABLET | Freq: Every day | ORAL | 0 refills | Status: DC

## 2019-09-14 MED ORDER — ANASTROZOLE 1 MG PO TABS: 1.0000 mg | ORAL_TABLET | Freq: Every day | ORAL | 4 refills | Status: DC

## 2019-09-14 MED ORDER — VENLAFAXINE HCL ER 37.5 MG PO CP24: 37.5000 mg | ORAL_CAPSULE | Freq: Every day | ORAL | 4 refills | Status: DC

## 2019-09-14 MED ORDER — GABAPENTIN 300 MG PO CAPS: 300.0000 mg | ORAL_CAPSULE | Freq: Every day | ORAL | 4 refills | Status: AC

## 2019-09-14 MED FILL — PANTOPRAZOLE SOD DR 20 MG T: 20 | 90 days supply | Qty: 90 | Fill #0

## 2019-09-14 NOTE — Progress Notes (Signed)
[facility]  Telephone:(336) [phone] Fax:(336) [phone]     ID: CANDICE TOBEY DOB: [birth year]  MR#: [id number]  XAJ#:[id number]  Patient Care Team: Patient, No Pcp Per as PCP - General (General Practice) Kem Boroughs, [place] as Nurse Practitioner (Nurse Practitioner) Jovita Kussmaul, MD as Consulting Physician (General Surgery) Magrinat, Virgie Dad, MD as Consulting Physician (Oncology) Eppie Gibson, MD as Attending Physician (Radiation Oncology) Rockwell Germany, RN as Registered Nurse Mauro Kaufmann, RN as Registered Nurse Holley Bouche, NP (Inactive) as Nurse Practitioner (Nurse Practitioner) Sylvan Cheese, NP as Nurse Practitioner (Hematology and Oncology) OTHER MD:  CHIEF COMPLAINT: Estrogen receptor positive breast cancer  CURRENT TREATMENT:  Anastrozole   BREAST CANCER HISTORY: From the original intake note:  [name] had routine bilateral screening mammography at the [facility] Center for 20 04/19/2015. This showed the breast density to be category B. A possible mass in the right breast with 2 diagnostic right mammography with tomosynthesis and right breast ultrasonography 04/01/2015. There was a small spiculated mass in the central right breast at approximately 11:30 o'clock. There was no palpable abnormality by exam. Ultrasound confirmed an irregularly marginated hypoechoic mass measuring 6 mm in the area in question. Ultrasound of the right axilla was unremarkable.  Biopsy of the mass in question was obtained 04/01/2015 and showed ([id number]) and invasive ductal carcinoma, grade 1, estrogen receptor 75% positive, progesterone receptor 90% positive, both with strong staining intensity, with an MIB-1 of 5%, and no HER-2 amplification, the signals ratio being 1.79 and the number per cell 2.60.  The patient's subsequent history is as detailed below   INTERVAL HISTORY: Sundy returns today for follow-up and treatment of her estrogen  receptor positive breast cancer. She was last seen here on 09/13/2018.   She continues on anastrozole.  She tolerates this well.  Hot flashes are better since she started venlafaxine.  Vaginal dryness is not an issue.  Derry's last bone density screening on 04/06/2016, showed a T-score of -1.1, which is considered osteopenic.    Since her last visit here, she underwent a digital diagnostic bilateral mammogram with tomography on 04/20/2019 showing: Breast Density Category B. There is no mammographic evidence of malignancy.     REVIEW OF SYSTEMS: Diamonds lost her 67 year old niece earlier this year.  That has been difficult on the whole family.  She herself is not able to exercise regularly partly because the gym has been closed and partly because she is having to work overtime because several people in her office have been out.  She is taking appropriate pandemic precautions.  Detailed review of systems today was otherwise noncontributory   PAST MEDICAL HISTORY: Past Medical History:  Diagnosis Date  . Allergy   . Breast cancer ([place]) 04/01/15   Right breast Invasive ductl carcinoma  . GERD (gastroesophageal reflux disease)   . Personal history of radiation therapy 2016  . SUI (stress urinary incontinence, female)   . Vitamin D deficiency     PAST SURGICAL HISTORY: Past Surgical History:  Procedure Laterality Date  . BREAST BIOPSY Right 04/01/2015   malignant  . BREAST LUMPECTOMY Right 04/19/2015  . BREAST LUMPECTOMY WITH RADIOACTIVE SEED AND SENTINEL LYMPH NODE BIOPSY Right 04/19/2015   Procedure: BREAST LUMPECTOMY WITH RADIOACTIVE SEED AND SENTINEL LYMPH NODE MAPPING;  Surgeon: Autumn Messing III, MD;  Location: [facility];  Service: General;  Laterality: Right;    FAMILY HISTORY Family History  Problem Relation Age of Onset  . Heart disease  Mother   . Rheum arthritis Mother   . Hypertension Mother   . Rheum arthritis Maternal Grandmother   . Heart attack Maternal  Grandmother   . Heart disease Maternal Grandmother   . Breast cancer Cousin    the patient's parents are still living, in their mid 84s. The patient had no brothers, one sister. There is no history of breast or ovarian cancer in the family to her knowledge.  GYNECOLOGIC HISTORY:  Patient's last menstrual period was 11/30/2004 (approximate). Menarche age 68, first live birth age 68. The patient is GX P1. She has been on hormone replacement until this diagnosis, with her last dose dose 04/05/2015.  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 She is divorced and lives I herself with 2 [name] and approximately 30 birds. Her daughter [name] lives in [place] where she is supply Electronics engineer for a [employer].    ADVANCED DIRECTIVES: Not in place. At the 05/11/2016 visit the patient was given the appropriate forms to complete and notarize at her discretion.   HEALTH MAINTENANCE: Social History   Tobacco Use  . Smoking status: Never Smoker  . Smokeless tobacco: Never Used  Substance Use Topics  . Alcohol use: No  . Drug use: No     Colonoscopy:  PAP:  Bone density:  Lipid panel:  Allergies  Allergen Reactions  . Influenza Vaccines   . Monistat [Miconazole] Other (See Comments)    Severe burning and pain with use    Current Outpatient Medications  Medication Sig Dispense Refill  . anastrozole (ARIMIDEX) 1 MG tablet Take 1 tablet (1 mg total) by mouth daily. 90 tablet 4  . gabapentin (NEURONTIN) 300 MG capsule Take 1 capsule (300 mg total) by mouth daily. 90 capsule 4  . lisinopril (PRINIVIL) 10 MG tablet Take 1 tablet (10 mg total) by mouth daily. 90 tablet 4  . pantoprazole (PROTONIX) 20 MG tablet Take 1 tablet (20 mg total) by mouth daily. 90 tablet 0  . venlafaxine XR (EFFEXOR-XR) 37.5 MG 24 hr capsule TAKE 1 CAPSULE BY MOUTH DAILY WITH BREAKFAST. 90 capsule 4   No current facility-administered medications for this visit.      OBJECTIVE: Middle-aged white woman in no acute distress  Vitals:   09/14/19 1315  BP: 136/78  Pulse: 73  Resp: 17  Temp: 98.3 F (36.8 C)  SpO2: 99%   Wt Readings from Last 3 Encounters:  09/14/19 192 lb 12.8 oz (87.5 kg)  09/13/18 191 lb 6.4 oz (86.8 kg)  05/20/18 193 lb (87.5 kg)   Body mass index is 32.33 kg/m.    ECOG FS:1 - Symptomatic but completely ambulatory  Ocular: Sclerae unicteric, pupils round and equal Ear-nose-throat: Wearing a mask Lymphatic: No cervical or supraclavicular adenopathy Lungs no rales or rhonchi Heart regular rate and rhythm Abd soft, nontender, positive bowel sounds MSK no focal spinal tenderness, no joint edema Neuro: non-focal, well-oriented, appropriate affect Breasts: The right breast is status post mastectomy and radiation.  There is no evidence of disease recurrence.  The left breast is benign.  Both axillae are benign.   LAB RESULTS:  CMP     Component Value Date/Time   NA 142 09/13/2018 1338   NA 141 09/06/2017 1136   K 4.0 09/13/2018 1338   K 3.8 09/06/2017 1136   CL 106 09/13/2018 1338   CO2 27 09/13/2018 1338   CO2 27 09/06/2017 1136   GLUCOSE 98 09/13/2018 1338  GLUCOSE 97 09/06/2017 1136   BUN 8 09/13/2018 1338   BUN 9.7 09/06/2017 1136   CREATININE 0.83 09/13/2018 1338   CREATININE 0.8 09/06/2017 1136   CALCIUM 9.5 09/13/2018 1338   CALCIUM 9.5 09/06/2017 1136   PROT 7.8 09/13/2018 1338   PROT 7.7 09/06/2017 1136   ALBUMIN 3.8 09/13/2018 1338   ALBUMIN 3.9 09/06/2017 1136   AST 19 09/13/2018 1338   AST 19 09/06/2017 1136   ALT 12 09/13/2018 1338   ALT 11 09/06/2017 1136   ALKPHOS 119 09/13/2018 1338   ALKPHOS 111 09/06/2017 1136   BILITOT 0.7 09/13/2018 1338   BILITOT 0.67 09/06/2017 1136   GFRNONAA >60 09/13/2018 1338   GFRAA >60 09/13/2018 1338    INo results found for: SPEP, UPEP  Lab Results  Component Value Date   WBC 5.8 09/14/2019   NEUTROABS 3.5 09/14/2019   HGB 13.0 09/14/2019   HCT 41.0  09/14/2019   MCV 86.7 09/14/2019   PLT 188 09/14/2019      Chemistry      Component Value Date/Time   NA 142 09/13/2018 1338   NA 141 09/06/2017 1136   K 4.0 09/13/2018 1338   K 3.8 09/06/2017 1136   CL 106 09/13/2018 1338   CO2 27 09/13/2018 1338   CO2 27 09/06/2017 1136   BUN 8 09/13/2018 1338   BUN 9.7 09/06/2017 1136   CREATININE 0.83 09/13/2018 1338   CREATININE 0.8 09/06/2017 1136      Component Value Date/Time   CALCIUM 9.5 09/13/2018 1338   CALCIUM 9.5 09/06/2017 1136   ALKPHOS 119 09/13/2018 1338   ALKPHOS 111 09/06/2017 1136   AST 19 09/13/2018 1338   AST 19 09/06/2017 1136   ALT 12 09/13/2018 1338   ALT 11 09/06/2017 1136   BILITOT 0.7 09/13/2018 1338   BILITOT 0.67 09/06/2017 1136       No results found for: LABCA2  No components found for: LABCA125  No results for input(s): INR in the last 168 hours.  Urinalysis    Component Value Date/Time   BILIRUBINUR n 10/01/2016 1256   PROTEINUR n 10/01/2016 1256   UROBILINOGEN negative 10/01/2016 1256   NITRITE n 10/01/2016 1256   LEUKOCYTESUR Negative 10/01/2016 1256    STUDIES: No results found.   ASSESSMENT: 68 y.o. [place], [place] woman status post right breast upper outer quadrant biopsy 04/01/2015 for a clinical T1b N0, stage IA invasive ductal carcinoma, grade 1, estrogen and progesterone receptor positive, HER-2 not amplified, with an MIB-1 of 5%  (1) status post right lumpectomy and sentinel lymph node sampling 04/19/2015 for a pT1c pN0, stage IA invasive ductal carcinoma, grade 1, with negative margins  (2) Oncotype DX score of 13 predicts a 10 year risk of outside the breast recurrence of 9% if the patient's only systemic therapy is tamoxifen for 5 years. It also predicts no benefit from chemotherapy  (3) adjuvant radiation 06/11/2015-07/22/2015: 1)Right breast / 50 Gy in 25 fractions 2) Right breast boost / 10 Gy in 5 fractions  (4) started anastrozole 08/31/2015  (a) bone density at the  [facility] 04/06/2016 shows a T score of - 1.1  PLAN: [name] is now 4 and half years out from the definitive surgery for her breast cancer with no evidence of disease recurrence.  This is very favorable.  She is continuing on anastrozole with good tolerance.  The plan is to continue that 1 more year after which she will be ready to "graduate".  It  would be helpful if she had a bone density next year at the same time as her mammography.  That order was placed but for some reason it did not get scheduled.  We will give it a second try.  She tells me she is being billed for her mammogram, which is not something that has happened before.  She will check with the breast center and see what the issue is.  If we can help of course we will be glad to.  Very likely occluding issue is involved.  She knows to call for any other problems that may develop before the next visit here.  Magrinat, Virgie Dad, MD  09/14/19 1:29 PM Medical Oncology and Hematology [facility] [place], [place] [address] Tel. [phone]    Fax. [phone]  I, Jacqualyn Posey am acting as a Education administrator for Chauncey Cruel, MD.   I, Lurline Del MD, have reviewed the above documentation for accuracy and completeness, and I agree with the above.

## 2019-09-15 ENCOUNTER — Telehealth: Payer: Self-pay | Admitting: Oncology

## 2019-09-15 NOTE — Telephone Encounter (Signed)
I talk with patient regarding schedule  

## 2019-09-27 MED FILL — GABAPENTIN 300 MG CAPSULE: 300 | 90 days supply | Qty: 90 | Fill #0

## 2019-10-09 ENCOUNTER — Other Ambulatory Visit: Payer: Self-pay | Admitting: Oncology

## 2019-10-09 MED FILL — LISINOPRIL 10 MG TABS: 10 | 90 days supply | Qty: 90 | Fill #0

## 2019-10-16 ENCOUNTER — Other Ambulatory Visit: Payer: Self-pay

## 2019-10-16 ENCOUNTER — Ambulatory Visit: Payer: 59 | Admitting: Certified Nurse Midwife

## 2019-10-16 ENCOUNTER — Encounter: Payer: Self-pay | Admitting: Certified Nurse Midwife

## 2019-10-16 VITALS — BP 122/80 | HR 70 | Temp 97.1°F | Resp 16 | Wt 194.0 lb

## 2019-10-16 DIAGNOSIS — R3915 Urgency of urination: Secondary | ICD-10-CM | POA: Diagnosis not present

## 2019-10-16 DIAGNOSIS — N898 Other specified noninflammatory disorders of vagina: Secondary | ICD-10-CM

## 2019-10-16 DIAGNOSIS — N390 Urinary tract infection, site not specified: Secondary | ICD-10-CM | POA: Diagnosis not present

## 2019-10-16 LAB — POCT URINALYSIS DIPSTICK
Bilirubin, UA: NEGATIVE
Blood, UA: NEGATIVE
Glucose, UA: NEGATIVE
Ketones, UA: NEGATIVE
Nitrite, UA: NEGATIVE
Protein, UA: NEGATIVE
Urobilinogen, UA: NEGATIVE E.U./dL — AB
pH, UA: 5 (ref 5.0–8.0)

## 2019-10-16 NOTE — Progress Notes (Signed)
68 y.o. Divorced Caucasian female G1P1001 here with complaint of  ? Urinary urgency , with onset on past 24 hours. Patient complaining of urinary urgency only after using vaginal product for infection.. Patient denies fever, chills, nausea or back pain. No new personal products or detergents. Denies stress incontinence.. Not sexually active. Menopausal with vaginal dryness, not treating.. Patient is also on Arimidex for breast cancer history. Patient feels she drinking adequate water intake. Does drink soda daily. No other health issues today.  Review of Systems  Constitutional: Negative.   HENT: Negative.   Eyes: Negative.   Respiratory: Negative.   Cardiovascular: Negative.   Gastrointestinal: Negative.   Genitourinary: Positive for frequency.  Musculoskeletal: Negative.   Skin: Negative.   Neurological: Negative.   Endo/Heme/Allergies: Negative.   Psychiatric/Behavioral: Negative.     poct urine-wbc tr  O: Healthy female WDWN Affect: Normal, orientation x 3 Skin : warm and dry CVAT: Negative bilateral Abdomen: negative for suprapubic tenderness  Pelvic exam: External genital area: normal, no lesions, but redness noted on vulva external and internal Bladder,Urethra non tender, Urethral meatus:non tender, slight increase pink Vagina: scant vaginal discharge, atrophic appearance  Cervix: normal, non tender Uterus:normal,non tender Adnexa: normal non tender, no fullness or masses   A: Vaginitis probably second degree to OTC vaginal product for itching, reaction to product Normal pelvic exam R/O UTI  P: Reviewed findings of vagina inspection and will treat if indicated once labs in.  Lab:Affirm Discussed aveeno oatmeal bath for comfort and 1% hydrocortisone cream to area if needed, apply external sparingly at bedtime. Reviewed warning signs and symptoms of UTI and need to advise if occurring. Encouraged to limit soda, tea, and coffee and be sure to increase water intake. Lab:  Urine culture   RV prn

## 2019-10-16 NOTE — Patient Instructions (Signed)
Urinary Tract Infection, Adult A urinary tract infection (UTI) is an infection of any part of the urinary tract. The urinary tract includes:  The kidneys.  The ureters.  The bladder.  The urethra. These organs make, store, and get rid of pee (urine) in the body. What are the causes? This is caused by germs (bacteria) in your genital area. These germs grow and cause swelling (inflammation) of your urinary tract. What increases the risk? You are more likely to develop this condition if:  You have a small, thin tube (catheter) to drain pee.  You cannot control when you pee or poop (incontinence).  You are female, and: ? You use these methods to prevent pregnancy: ? A medicine that kills sperm (spermicide). ? A device that blocks sperm (diaphragm). ? You have low levels of a female hormone (estrogen). ? You are pregnant.  You have genes that add to your risk.  You are sexually active.  You take antibiotic medicines.  You have trouble peeing because of: ? A prostate that is bigger than normal, if you are female. ? A blockage in the part of your body that drains pee from the bladder (urethra). ? A kidney stone. ? A nerve condition that affects your bladder (neurogenic bladder). ? Not getting enough to drink. ? Not peeing often enough.  You have other conditions, such as: ? Diabetes. ? A weak disease-fighting system (immune system). ? Sickle cell disease. ? Gout. ? Injury of the spine. What are the signs or symptoms? Symptoms of this condition include:  Needing to pee right away (urgently).  Peeing often.  Peeing small amounts often.  Pain or burning when peeing.  Blood in the pee.  Pee that smells bad or not like normal.  Trouble peeing.  Pee that is cloudy.  Fluid coming from the vagina, if you are female.  Pain in the belly or lower back. Other symptoms include:  Throwing up (vomiting).  No urge to eat.  Feeling mixed up (confused).  Being tired  and grouchy (irritable).  A fever.  Watery poop (diarrhea). How is this treated? This condition may be treated with:  Antibiotic medicine.  Other medicines.  Drinking enough water. Follow these instructions at home:  Medicines  Take over-the-counter and prescription medicines only as told by your doctor.  If you were prescribed an antibiotic medicine, take it as told by your doctor. Do not stop taking it even if you start to feel better. General instructions  Make sure you: ? Pee until your bladder is empty. ? Do not hold pee for a long time. ? Empty your bladder after sex. ? Wipe from front to back after pooping if you are a female. Use each tissue one time when you wipe.  Drink enough fluid to keep your pee pale yellow.  Keep all follow-up visits as told by your doctor. This is important. Contact a doctor if:  You do not get better after 1-2 days.  Your symptoms go away and then come back. Get help right away if:  You have very bad back pain.  You have very bad pain in your lower belly.  You have a fever.  You are sick to your stomach (nauseous).  You are throwing up. Summary  A urinary tract infection (UTI) is an infection of any part of the urinary tract.  This condition is caused by germs in your genital area.  There are many risk factors for a UTI. These include having a small, thin   tube to drain pee and not being able to control when you pee or poop.  Treatment includes antibiotic medicines for germs.  Drink enough fluid to keep your pee pale yellow. This information is not intended to replace advice given to you by your health care provider. Make sure you discuss any questions you have with your health care provider. Document Released: 05/04/2008 Document Revised: 11/03/2018 Document Reviewed: 05/26/2018 Elsevier Patient Education  2020 [place]. Atrophic Vaginitis  Atrophic vaginitis is a condition in which the tissues that line the vagina  become dry and thin. This condition is most common in women who have stopped having regular menstrual periods (are in menopause). This usually starts when a woman is 69-66 years old. That is the time when a woman's estrogen levels begin to drop (decrease). Estrogen is a female hormone. It helps to keep the tissues of the vagina moist. It stimulates the vagina to produce a clear fluid that lubricates the vagina for sexual intercourse. This fluid also protects the vagina from infection. Lack of estrogen can cause the lining of the vagina to get thinner and dryer. The vagina may also shrink in size. It may become less elastic. Atrophic vaginitis tends to get worse over time as a woman's estrogen level drops. What are the causes? This condition is caused by the normal drop in estrogen that happens around the time of menopause. What increases the risk? Certain conditions or situations may lower a woman's estrogen level, leading to a higher risk for atrophic vaginitis. You are more likely to develop this condition if:  You are taking medicines that block estrogen.  You have had your ovaries removed.  You are being treated for cancer with X-ray (radiation) or medicines (chemotherapy).  You have given birth or are breastfeeding.  You are older than age 38.  You smoke. What are the signs or symptoms? Symptoms of this condition include:  Pain, soreness, or bleeding during sexual intercourse (dyspareunia).  Vaginal burning, irritation, or itching.  Pain or bleeding when a speculum is used in a vaginal exam (pelvic exam).  Having burning pain when passing urine.  Vaginal discharge that is brown or yellow. In some cases, there are no symptoms. How is this diagnosed? This condition is diagnosed by taking a medical history and doing a physical exam. This will include a pelvic exam that checks the vaginal tissues. Though rare, you may also have other tests, including:  A urine test.  A test that  checks the acid balance in your vagina (acid balance test). How is this treated? Treatment for this condition depends on how severe your symptoms are. Treatment may include:  Using an over-the-counter vaginal lubricant before sex.  Using a long-acting vaginal moisturizer.  Using low-dose vaginal estrogen for moderate to severe symptoms that do not respond to other treatments. Options include creams, tablets, and inserts (vaginal rings). Before you use a vaginal estrogen, tell your health care provider if you have a history of: ? Breast cancer. ? Endometrial cancer. ? Blood clots. If you are not sexually active and your symptoms are very mild, you may not need treatment. Follow these instructions at home: Medicines  Take over-the-counter and prescription medicines only as told by your health care provider. Do not use herbal or alternative medicines unless your health care provider says that you can.  Use over-the-counter creams, lubricants, or moisturizers for dryness only as directed by your health care provider. General instructions  If your atrophic vaginitis is caused by menopause,  discuss all of your menopause symptoms and treatment options with your health care provider.  Do not douche.  Do not use products that can make your vagina dry. These include: ? Scented feminine sprays. ? Scented tampons. ? Scented soaps.  Vaginal intercourse can help to improve blood flow and elasticity of vaginal tissue. If it hurts to have sex, try using a lubricant or moisturizer just before having intercourse. Contact a health care provider if:  Your discharge looks different than normal.  Your vagina has an unusual smell.  You have new symptoms.  Your symptoms do not improve with treatment.  Your symptoms get worse. Summary  Atrophic vaginitis is a condition in which the tissues that line the vagina become dry and thin. It is most common in women who have stopped having regular  menstrual periods (are in menopause).  Treatment options include using vaginal lubricants and low-dose vaginal estrogen.  Contact a health care provider if your vagina has an unusual smell, or if your symptoms get worse or do not improve after treatment. This information is not intended to replace advice given to you by your health care provider. Make sure you discuss any questions you have with your health care provider. Document Released: 04/02/2015 Document Revised: 10/29/2017 Document Reviewed: 08/12/2017 Elsevier Patient Education  2020 [employer].

## 2019-10-17 ENCOUNTER — Telehealth: Payer: Self-pay

## 2019-10-17 LAB — VAGINITIS/VAGINOSIS, DNA PROBE
Candida Species: NEGATIVE
Gardnerella vaginalis: NEGATIVE
Trichomonas vaginosis: NEGATIVE

## 2019-10-17 NOTE — Telephone Encounter (Signed)
Left message for call back.

## 2019-10-17 NOTE — Telephone Encounter (Signed)
-----   Message from Regina Eck, CNM sent at 10/17/2019 12:38 PM EST ----- Notify patient her vaginal screening for yeast, BV and trichomonas was negative. From her exam she had vaginal dryness and also reaction to a cream she used. Discussed coconut oil or Olive oil for dryness. Needs to use this or OTC Replens 2-3 times weekly to help with dryness symptoms Urine culture pending.   Patient status?

## 2019-10-18 LAB — URINE CULTURE

## 2019-10-18 NOTE — Telephone Encounter (Signed)
Patient notified of results. See lab 

## 2019-11-10 MED FILL — VENLAFAXINE HCL ER 37.5 MG: 37.5 | 90 days supply | Qty: 90 | Fill #2

## 2019-11-10 MED FILL — ANASTROZOLE 1 MG TABLET: 1 | 90 days supply | Qty: 90 | Fill #0

## 2019-12-19 ENCOUNTER — Other Ambulatory Visit: Payer: Self-pay | Admitting: Oncology

## 2019-12-19 MED FILL — PANTOPRAZOLE SOD DR 20 MG T: 20 | 90 days supply | Qty: 90 | Fill #0

## 2019-12-28 MED FILL — GABAPENTIN 300 MG CAPSULE: 300 | 90 days supply | Qty: 90 | Fill #1

## 2020-01-10 MED FILL — LISINOPRIL 10 MG TABS: 10 | 90 days supply | Qty: 90 | Fill #1

## 2020-02-09 MED FILL — VENLAFAXINE HCL ER 37.5 MG: 37.5 | 90 days supply | Qty: 90 | Fill #3

## 2020-02-09 MED FILL — ANASTROZOLE 1 MG TABLET: 1 | 90 days supply | Qty: 90 | Fill #1

## 2020-02-21 ENCOUNTER — Encounter: Payer: Self-pay | Admitting: Certified Nurse Midwife

## 2020-03-19 ENCOUNTER — Other Ambulatory Visit: Payer: Self-pay | Admitting: Oncology

## 2020-03-19 DIAGNOSIS — Z9889 Other specified postprocedural states: Secondary | ICD-10-CM

## 2020-03-19 DIAGNOSIS — Z853 Personal history of malignant neoplasm of breast: Secondary | ICD-10-CM

## 2020-03-20 ENCOUNTER — Other Ambulatory Visit: Payer: Self-pay | Admitting: Oncology

## 2020-03-21 MED FILL — PANTOPRAZOLE SOD DR 20 MG T: 20 | 90 days supply | Qty: 90 | Fill #0

## 2020-03-28 MED FILL — GABAPENTIN 300 MG CAPSULE: 300 | 90 days supply | Qty: 90 | Fill #2

## 2020-04-15 ENCOUNTER — Other Ambulatory Visit: Payer: Self-pay | Admitting: Oncology

## 2020-04-15 MED FILL — LISINOPRIL 10 MG TABS: 10 | 90 days supply | Qty: 90 | Fill #0

## 2020-04-22 ENCOUNTER — Other Ambulatory Visit: Payer: Self-pay

## 2020-04-22 ENCOUNTER — Ambulatory Visit
Admission: RE | Admit: 2020-04-22 | Discharge: 2020-04-22 | Disposition: A | Discharge status: Home / Self Care | Payer: 59 | Source: Ambulatory Visit | Attending: Oncology | Admitting: Oncology

## 2020-04-22 DIAGNOSIS — Z853 Personal history of malignant neoplasm of breast: Secondary | ICD-10-CM

## 2020-04-22 DIAGNOSIS — Z9889 Other specified postprocedural states: Secondary | ICD-10-CM

## 2020-04-22 DIAGNOSIS — R928 Other abnormal and inconclusive findings on diagnostic imaging of breast: Secondary | ICD-10-CM | POA: Diagnosis not present

## 2020-05-06 ENCOUNTER — Other Ambulatory Visit: Payer: Self-pay | Admitting: Oncology

## 2020-06-19 ENCOUNTER — Other Ambulatory Visit: Payer: Self-pay | Admitting: Oncology

## 2020-06-19 MED FILL — PANTOPRAZOLE SOD DR 20 MG T: 20 | 90 days supply | Qty: 90 | Fill #0

## 2020-06-28 MED FILL — GABAPENTIN 300 MG CAPSULE: 300 | 90 days supply | Qty: 90 | Fill #3

## 2020-07-17 ENCOUNTER — Telehealth: Payer: Self-pay

## 2020-07-17 NOTE — Telephone Encounter (Signed)
RN left voicemail for return call.   Re: Covid Vaccination

## 2020-07-19 MED FILL — LISINOPRIL 10 MG TABS: 10 | 90 days supply | Qty: 90 | Fill #1

## 2020-08-07 ENCOUNTER — Other Ambulatory Visit: Payer: Self-pay | Admitting: Oncology

## 2020-08-07 MED FILL — ANASTROZOLE 1 MG TABLET: 1 | 90 days supply | Qty: 90 | Fill #3

## 2020-08-08 MED FILL — VENLAFAXINE HCL ER 37.5 MG: 37.5 | 90 days supply | Qty: 90 | Fill #0

## 2020-09-15 NOTE — Progress Notes (Signed)
[facility]  [phone] [phone]) [phone]     ID: CANDE HAMMERMAN DOB: 1951-11-18  MR#: [id number]  NGE#:[id number]  Patient Care Team: Patient, No Pcp Per as PCP - General (General Practice) Ria Comment, FNP as Nurse Practitioner (Nurse Practitioner) Griselda Miner, MD as Consulting Physician (General Surgery) Quintyn Dombek, Valentino Hue, MD as Consulting Physician (Oncology) Lonie Peak, MD as Attending Physician (Radiation Oncology) Donnelly Angelica, RN as Registered Nurse Pershing Proud, RN as Registered Nurse Hubbard Hartshorn, NP (Inactive) as Nurse Practitioner (Nurse Practitioner) Salomon Fick, NP as Nurse Practitioner (Hematology and Oncology) OTHER MD:  CHIEF COMPLAINT: Estrogen receptor positive breast cancer  CURRENT TREATMENT: Completing 5 years of anastrozole   INTERVAL HISTORY: Deborah Mercado returns today for follow-up of her estrogen receptor positive breast cancer.   She continues on anastrozole.  She tolerates this well.  Hot flashes are better since she started venlafaxine.  Vaginal dryness is not an issue.  Deborah Mercado's last bone density screening on 04/06/2016, showed a T-score of -1.1, which is borderline osteopenic.  Since her last visit, she underwent bilateral diagnostic mammography with tomography at [facility] on 04/22/2020 showing: breast density category B; no evidence of malignancy in either breast.      REVIEW OF SYSTEMS: Deborah Mercado is doing "good".  She has received the Covid vaccine x2 and is considering a booster.  Her family is doing fine.  She is of course concerned about her 52 year old mother who still stays by herself during the day.  A detailed review of systems today was otherwise stable   BREAST CANCER HISTORY: From the original intake note:  Deborah Mercado had routine bilateral screening mamm[facility] the [facility] for [facility] 04/19/2015. This showed the breast density to be category B. A possible  mass in the right breast with 2 diagnostic right mammography with tomosynthesis and right breast ultrasonography 04/01/2015. There was a small spiculated mass in the central right breast at approximately 11:30 o'clock. There was no palpable abnormality by exam. Ultrasound confirmed an irregularly marginated hypoechoic mass measuring 6 mm in the area in question. Ultrasound of the right axilla was unremarkable.  Biopsy of the mass in question was obtained 04/01/2015 and showed ([id number]) and invasive ductal carcinoma, grade 1, estrogen receptor 75% positive, progesterone receptor 90% positive, both with strong staining intensity, with an MIB-1 of 5%, and no HER-2 amplification, the signals ratio being 1.79 and the number per cell 2.60.  The patient's subsequent history is as detailed below   PAST MEDICAL HISTORY: Past Medical History:  Diagnosis Date  . Allergy   . Breast cancer (HCC) 04/01/15   Right breast Invasive ductl carcinoma  . GERD (gastroesophageal reflux disease)   . Personal history of radiation therapy 2016  . SUI (stress urinary incontinence, female)   . Vitamin D deficiency     PAST SURGICAL HISTORY: Past Surgical History:  Procedure Laterality Date  . BREAST BIOPSY Right 04/01/2015   malignant  . BREAST LUMPECTOMY Right 04/19/2015  . BREAST LUMPECTOMY WITH RADIOACTIVE SEED AND SENTINEL LYMPH NODE BIOPSY Right 04/19/2015   Procedure: BREAST LUMPECTOMY WITH RADIOACTIVE SEED AND SENTINEL LYMPH NODE MAPPING;  Surgeon: Chevis Pretty III, MD;  Location: [facility];  Service: General;  Laterality: Right;    FAMILY HISTORY Family History  Problem Relation Age of Onset  . Heart disease Mother   . Rheum arthritis Mother   . Hypertension Mother   . Rheum arthritis Maternal Grandmother   .  Heart attack Maternal Grandmother   . Heart disease Maternal Grandmother   . Breast cancer Cousin    the patient's parents are still living, in their mid 26s. The patient had no  brothers, one sister. There is no history of breast or ovarian cancer in the family to her knowledge.   GYNECOLOGIC HISTORY:  Patient's last menstrual period was 11/30/2004 (approximate). Menarche age 65, first live birth age 63. The patient is GX P1. She has been on hormone replacement until this diagnosis, with her last dose dose 04/05/2015.   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 She is divorced and lives by herself with 2 miniature schnauzers and more than 50 birds. Her daughter Guadalupe Dawn lives in [place] where she is supply Corporate treasurer for a [employer].    ADVANCED DIRECTIVES: Not in place. At the 05/11/2016 visit the patient was given the appropriate forms to complete and notarize at her discretion.   HEALTH MAINTENANCE: Social History   Tobacco Use  . Smoking status: Never Smoker  . Smokeless tobacco: Never Used  Vaping Use  . Vaping Use: Never used  Substance Use Topics  . Alcohol use: No  . Drug use: No     Colonoscopy:  PAP:  Bone density:  Lipid panel:  Allergies  Allergen Reactions  . Influenza Vaccines   . Monistat [Miconazole] Other (See Comments)    Severe burning and pain with use    Current Outpatient Medications  Medication Sig Dispense Refill  . anastrozole (ARIMIDEX) 1 MG tablet Take 1 tablet (1 mg total) by mouth daily. 90 tablet 4  . gabapentin (NEURONTIN) 300 MG capsule Take 1 capsule (300 mg total) by mouth daily. 90 capsule 4  . lisinopril (ZESTRIL) 10 MG tablet TAKE 1 TABLET BY MOUTH DAILY. 90 tablet 1  . pantoprazole (PROTONIX) 20 MG tablet TAKE 1 TABLET (20 MG TOTAL) BY MOUTH DAILY. 90 tablet 0  . venlafaxine XR (EFFEXOR-XR) 37.5 MG 24 hr capsule TAKE 1 CAPSULE BY MOUTH DAILY WITH BREAKFAST. 90 capsule 4   No current facility-administered medications for this visit.    OBJECTIVE: White woman who appears stated age  Vitals:   09/16/20 1322  BP: 139/74  Pulse: 90  Resp: 18  Temp: 97.6 F (36.4 C)    SpO2: 97%   Wt Readings from Last 3 Encounters:  09/16/20 192 lb 3.2 oz (87.2 kg)  10/16/19 194 lb (88 kg)  09/14/19 192 lb 12.8 oz (87.5 kg)   Body mass index is 32.23 kg/m.    ECOG FS:1 - Symptomatic but completely ambulatory  Sclerae unicteric, EOMs intact Wearing a mask No cervical or supraclavicular adenopathy Lungs no rales or rhonchi Heart regular rate and rhythm Abd soft, nontender, positive bowel sounds MSK no focal spinal tenderness, no upper extremity lymphedema Neuro: nonfocal, well oriented, appropriate affect Breasts: The right breast has undergone mastectomy followed by radiation.  There is no evidence of disease recurrence.  Left breast is benign.  Both axillae are benign.   LAB RESULTS:  CMP     Component Value Date/Time   NA 141 09/16/2020 1221   NA 141 09/06/2017 1136   K 4.0 09/16/2020 1221   K 3.8 09/06/2017 1136   CL 108 09/16/2020 1221   CO2 28 09/16/2020 1221   CO2 27 09/06/2017 1136   GLUCOSE 114 (H) 09/16/2020 1221   GLUCOSE 97 09/06/2017 1136   BUN 10 09/16/2020 1221   BUN 9.7 09/06/2017 1136  CREATININE 0.82 09/16/2020 1221   CREATININE 0.90 09/14/2019 1252   CREATININE 0.8 09/06/2017 1136   CALCIUM 9.2 09/16/2020 1221   CALCIUM 9.5 09/06/2017 1136   PROT 7.2 09/16/2020 1221   PROT 7.7 09/06/2017 1136   ALBUMIN 3.6 09/16/2020 1221   ALBUMIN 3.9 09/06/2017 1136   AST 17 09/16/2020 1221   AST 17 09/14/2019 1252   AST 19 09/06/2017 1136   ALT 10 09/16/2020 1221   ALT 13 09/14/2019 1252   ALT 11 09/06/2017 1136   ALKPHOS 88 09/16/2020 1221   ALKPHOS 111 09/06/2017 1136   BILITOT 0.5 09/16/2020 1221   BILITOT 0.6 09/14/2019 1252   BILITOT 0.67 09/06/2017 1136   GFRNONAA >60 09/16/2020 1221   GFRNONAA >60 09/14/2019 1252   GFRAA >60 09/14/2019 1252    INo results found for: SPEP, UPEP  Lab Results  Component Value Date   WBC 5.5 09/16/2020   NEUTROABS 3.4 09/16/2020   HGB 12.5 09/16/2020   HCT 38.6 09/16/2020   MCV 87.1  09/16/2020   PLT 184 09/16/2020      Chemistry      Component Value Date/Time   NA 141 09/16/2020 1221   NA 141 09/06/2017 1136   K 4.0 09/16/2020 1221   K 3.8 09/06/2017 1136   CL 108 09/16/2020 1221   CO2 28 09/16/2020 1221   CO2 27 09/06/2017 1136   BUN 10 09/16/2020 1221   BUN 9.7 09/06/2017 1136   CREATININE 0.82 09/16/2020 1221   CREATININE 0.90 09/14/2019 1252   CREATININE 0.8 09/06/2017 1136      Component Value Date/Time   CALCIUM 9.2 09/16/2020 1221   CALCIUM 9.5 09/06/2017 1136   ALKPHOS 88 09/16/2020 1221   ALKPHOS 111 09/06/2017 1136   AST 17 09/16/2020 1221   AST 17 09/14/2019 1252   AST 19 09/06/2017 1136   ALT 10 09/16/2020 1221   ALT 13 09/14/2019 1252   ALT 11 09/06/2017 1136   BILITOT 0.5 09/16/2020 1221   BILITOT 0.6 09/14/2019 1252   BILITOT 0.67 09/06/2017 1136       No results found for: LABCA2  No components found for: BJYNW295  No results for input(s): INR in the last 168 hours.  Urinalysis    Component Value Date/Time   BILIRUBINUR n 10/16/2019 1413   PROTEINUR Negative 10/16/2019 1413   UROBILINOGEN negative (A) 10/16/2019 1413   NITRITE n 10/16/2019 1413   LEUKOCYTESUR Trace (A) 10/16/2019 1413    STUDIES: No results found.   ASSESSMENT: 69 y.o. [place], [place] woman status post right breast upper outer quadrant biopsy 04/01/2015 for a clinical T1b N0, stage IA invasive ductal carcinoma, grade 1, estrogen and progesterone receptor positive, HER-2 not amplified, with an MIB-1 of 5%  (1) status post right lumpectomy and sentinel lymph node sampling 04/19/2015 for a pT1c pN0, stage IA invasive ductal carcinoma, grade 1, with negative margins  (2) Oncotype DX score of 13 predicts a 10 year risk of outside the breast recurrence of 9% if the patient's only systemic therapy is tamoxifen for 5 years. It also predicts no benefit from chemotherapy  (3) adjuvant radiation 06/11/2015-07/22/2015: 1)Right breast / 50 Gy in 25 fractions 2)  Right breast boost / 10 Gy in 5 fractions  (4) started anastrozole 08/31/2015, completing 5 years October 2021  (a) bone density at the [facility] 04/06/2016 shows a T score of - 1.1   PLAN: Deborah Mercado is now 5 and half years out from definitive surgery for  her breast cancer with no evidence of disease recurrence.  This is very favorable.  She is t completing 5 years of anastrozole this month.  Given her overall good prognosis I do not think continuing anastrozole an additional 2 years would be useful in her case and so we are stopping the medication at this point.  She understands she does not need to taper off.  At this point I feel comfortable releasing her to her primary care physicians.  All she will need in terms of breast cancer follow-up is a yearly mammogram and a yearly physician breast and chest wall exam  I will be glad to see Deborah Mercado again at any point in the future if and when the need arises but as of now are making no further routine appointments for her here.  Total encounter time 25 minutes.*  Sandeep Delagarza, Valentino Hue, MD  09/16/20 5:31 PM Medical Oncology and Hematology [facility] [address] [place], [place] [address] Tel. [phone]    Fax. [phone]   I, Mickie Bail, am acting as scribe for Dr. Valentino Hue. Anis Degidio.  I, Ruthann Cancer MD, have reviewed the above documentation for accuracy and completeness, and I agree with the above.    *Total Encounter Time as defined by the Centers for Medicare and Medicaid Services includes, in addition to the face-to-face time of a patient visit (documented in the note above) non-face-to-face time: obtaining and reviewing outside history, ordering and reviewing medications, tests or procedures, care coordination (communications with other health care professionals or caregivers) and documentation in the medical record.

## 2020-09-16 ENCOUNTER — Inpatient Hospital Stay: Payer: 59

## 2020-09-16 ENCOUNTER — Inpatient Hospital Stay: Payer: 59 | Attending: Oncology | Admitting: Oncology

## 2020-09-16 ENCOUNTER — Other Ambulatory Visit: Payer: Self-pay

## 2020-09-16 VITALS — BP 139/74 | HR 90 | Temp 97.6°F | Resp 18 | Ht 64.75 in | Wt 192.2 lb

## 2020-09-16 DIAGNOSIS — Z170 Estrogen receptor positive status [ER+]: Secondary | ICD-10-CM | POA: Diagnosis not present

## 2020-09-16 DIAGNOSIS — Z923 Personal history of irradiation: Secondary | ICD-10-CM | POA: Diagnosis not present

## 2020-09-16 DIAGNOSIS — C50411 Malignant neoplasm of upper-outer quadrant of right female breast: Secondary | ICD-10-CM

## 2020-09-16 DIAGNOSIS — I10 Essential (primary) hypertension: Secondary | ICD-10-CM

## 2020-09-16 LAB — COMPREHENSIVE METABOLIC PANEL
ALT: 10 U/L (ref 0–44)
AST: 17 U/L (ref 15–41)
Albumin: 3.6 g/dL (ref 3.5–5.0)
Alkaline Phosphatase: 88 U/L (ref 38–126)
Anion gap: 5 (ref 5–15)
BUN: 10 mg/dL (ref 8–23)
CO2: 28 mmol/L (ref 22–32)
Calcium: 9.2 mg/dL (ref 8.9–10.3)
Chloride: 108 mmol/L (ref 98–111)
Creatinine, Ser: 0.82 mg/dL (ref 0.44–1.00)
GFR, Estimated: >60 mL/min (ref >60)
Glucose, Bld: 114 mg/dL — ABNORMAL HIGH (ref 70–99)
Potassium: 4 mmol/L (ref 3.5–5.1)
Sodium: 141 mmol/L (ref 135–145)
Total Bilirubin: 0.5 mg/dL (ref 0.3–1.2)
Total Protein: 7.2 g/dL (ref 6.5–8.1)

## 2020-09-16 LAB — CBC WITH DIFFERENTIAL/PLATELET
Abs Immature Granulocytes: 0.02 10*3/uL (ref 0.00–0.07)
Basophils Absolute: 0.1 10*3/uL (ref 0.0–0.1)
Basophils Relative: 1 %
Eosinophils Absolute: 0.1 10*3/uL (ref 0.0–0.5)
Eosinophils Relative: 2 %
HCT: 38.6 % (ref 36.0–46.0)
Hemoglobin: 12.5 g/dL (ref 12.0–15.0)
Immature Granulocytes: 0 %
Lymphocytes Relative: 29 %
Lymphs Abs: 1.6 10*3/uL (ref 0.7–4.0)
MCH: 28.2 pg (ref 26.0–34.0)
MCHC: 32.4 g/dL (ref 30.0–36.0)
MCV: 87.1 fL (ref 80.0–100.0)
Monocytes Absolute: 0.3 10*3/uL (ref 0.1–1.0)
Monocytes Relative: 6 %
Neutro Abs: 3.4 10*3/uL (ref 1.7–7.7)
Neutrophils Relative %: 62 %
Platelets: 184 10*3/uL (ref 150–400)
RBC: 4.43 MIL/uL (ref 3.87–5.11)
RDW: 13.6 % (ref 11.5–15.5)
WBC: 5.5 10*3/uL (ref 4.0–10.5)
nRBC: 0 % (ref 0.0–0.2)

## 2020-09-18 ENCOUNTER — Telehealth: Payer: Self-pay | Admitting: Oncology

## 2020-09-18 NOTE — Telephone Encounter (Signed)
No 10/18 los, no changes made to pt schedule

## 2020-09-30 ENCOUNTER — Other Ambulatory Visit: Payer: Self-pay | Admitting: Oncology

## 2020-10-04 ENCOUNTER — Other Ambulatory Visit (HOSPITAL_COMMUNITY): Payer: Self-pay | Admitting: Family Medicine

## 2020-10-04 DIAGNOSIS — R350 Frequency of micturition: Secondary | ICD-10-CM | POA: Diagnosis not present

## 2020-10-04 DIAGNOSIS — K219 Gastro-esophageal reflux disease without esophagitis: Secondary | ICD-10-CM | POA: Diagnosis not present

## 2020-10-04 DIAGNOSIS — M545 Low back pain, unspecified: Secondary | ICD-10-CM | POA: Diagnosis not present

## 2020-10-04 DIAGNOSIS — Z5320 Procedure and treatment not carried out because of patient's decision for unspecified reasons: Secondary | ICD-10-CM | POA: Diagnosis not present

## 2020-10-04 MED FILL — GABAPENTIN 300 MG CAPSULE: 300 | 90 days supply | Qty: 90 | Fill #0

## 2020-10-04 MED FILL — PANTOPRAZOLE SOD DR 20 MG T: 20 | 90 days supply | Qty: 90 | Fill #0

## 2020-10-04 NOTE — Progress Notes (Deleted)
69 y.o. G53P1001 Divorced White or Caucasian female here for annual exam.      Patient's last menstrual period was 11/30/2004 (approximate).          Sexually active: {yes no:314532}  The current method of family planning is post menopausal status.    Exercising: {yes no:314532}  {types:19826} Smoker:  {YES NO:22349}  Health Maintenance: Pap:  04-06-16 neg HPV HR neg History of abnormal Pap:  no MMG:  04-22-2020 category b density birads 2:neg Colonoscopy:  none BMD:   04-06-16 TDaP:  2009 Gardasil:   *** Covid-19: *** Pneumonia vaccine(s):  *** Shingrix:   *** Hep C testing: neg 2017 Screening Labs: ***   reports that she has never smoked. She has never used smokeless tobacco. She reports that she does not drink alcohol and does not use drugs.  Past Medical History:  Diagnosis Date  . Allergy   . Breast cancer ([facility]) 04/01/15   Right breast Invasive ductl carcinoma  . GERD (gastroesophageal reflux disease)   . Personal history of radiation therapy 2016  . SUI (stress urinary incontinence, female)   . Vitamin D deficiency     Past Surgical History:  Procedure Laterality Date  . BREAST BIOPSY Right 04/01/2015   malignant  . BREAST LUMPECTOMY Right 04/19/2015  . BREAST LUMPECTOMY WITH RADIOACTIVE SEED AND SENTINEL LYMPH NODE BIOPSY Right 04/19/2015   Procedure: BREAST LUMPECTOMY WITH RADIOACTIVE SEED AND SENTINEL LYMPH NODE MAPPING;  Surgeon: Autumn Messing III, MD;  Location: [facility];  Service: General;  Laterality: Right;    Current Outpatient Medications  Medication Sig Dispense Refill  . anastrozole (ARIMIDEX) 1 MG tablet Take 1 tablet (1 mg total) by mouth daily. 90 tablet 4  . gabapentin (NEURONTIN) 300 MG capsule Take 1 capsule (300 mg total) by mouth daily. 90 capsule 4  . lisinopril (ZESTRIL) 10 MG tablet TAKE 1 TABLET BY MOUTH DAILY. 90 tablet 1  . pantoprazole (PROTONIX) 20 MG tablet TAKE 1 TABLET (20 MG TOTAL) BY MOUTH DAILY. 90 tablet 0  . venlafaxine  XR (EFFEXOR-XR) 37.5 MG 24 hr capsule TAKE 1 CAPSULE BY MOUTH DAILY WITH BREAKFAST. 90 capsule 4   No current facility-administered medications for this visit.    Family History  Problem Relation Age of Onset  . Heart disease Mother   . Rheum arthritis Mother   . Hypertension Mother   . Rheum arthritis Maternal Grandmother   . Heart attack Maternal Grandmother   . Heart disease Maternal Grandmother   . Breast cancer Cousin     Review of Systems  Exam:   LMP 11/30/2004 (Approximate)      General appearance: alert, cooperative and appears stated age Head: Normocephalic, without obvious abnormality, atraumatic Neck: no adenopathy, supple, symmetrical, trachea midline and thyroid {EXAM; THYROID:18604} Lungs: clear to auscultation bilaterally Breasts: {Exam; breast:13139::"normal appearance, no masses or tenderness"} Heart: regular rate and rhythm Abdomen: soft, non-tender; bowel sounds normal; no masses,  no organomegaly Extremities: extremities normal, atraumatic, no cyanosis or edema Skin: Skin color, texture, turgor normal. No rashes or lesions Lymph nodes: Cervical, supraclavicular, and axillary nodes normal. No abnormal inguinal nodes palpated Neurologic: Grossly normal   Pelvic: External genitalia:  no lesions              Urethra:  normal appearing urethra with no masses, tenderness or lesions              Bartholins and Skenes: normal  Vagina: normal appearing vagina with normal color and discharge, no lesions              Cervix: {exam; cervix:14595}              Pap taken: {yes no:314532} Bimanual Exam:  Uterus:  {exam; uterus:12215}              Adnexa: {exam; adnexa:12223}               Rectovaginal: Confirms               Anus:  normal sphincter tone, no lesions  ***, CMA Chaperone was present for exam.  A:  Well Woman with normal exam  P:   {plan; gyn:5269::"mammogram","pap smear","return annually or prn"}

## 2020-10-07 ENCOUNTER — Ambulatory Visit: Payer: 59 | Admitting: Nurse Practitioner

## 2020-10-08 ENCOUNTER — Telehealth: Payer: Self-pay

## 2020-10-08 NOTE — Telephone Encounter (Signed)
Patient is experiencing lower back pain. She thinks she may have an infection.

## 2020-10-08 NOTE — Telephone Encounter (Signed)
I agree with PCP re-evaluation.

## 2020-10-08 NOTE — Telephone Encounter (Signed)
Spoke with patient and she is complaining of low back pain x 1 week. She states in early morning it is difficult to get out of bed. She has seen PCP when this started and evaluated for UTI. Her urine was negative. She has no other symptoms other than low back pain. Patient denies fever, vaginal discharge, pelvic pain or urinary symptoms.  I advised patient to contact PCP again for their advice and maybe referral for back pain. Also advised her to call back if felt he PCP was not helping her.  Routed to provider.

## 2020-10-29 ENCOUNTER — Other Ambulatory Visit: Payer: Self-pay | Admitting: Oncology

## 2020-10-29 MED FILL — LISINOPRIL 10 MG TABS: 10 | 30 days supply | Qty: 30 | Fill #0

## 2020-11-07 ENCOUNTER — Other Ambulatory Visit (HOSPITAL_COMMUNITY): Payer: Self-pay | Admitting: Family Medicine

## 2020-11-07 DIAGNOSIS — M62838 Other muscle spasm: Secondary | ICD-10-CM | POA: Diagnosis not present

## 2020-11-07 MED FILL — MELOXICAM 15 MG TABLET: 15 | 30 days supply | Qty: 30 | Fill #0

## 2020-11-07 MED FILL — CYCLOBENZAPRINE HCL 5 MG TA: 5 | 10 days supply | Qty: 30 | Fill #0

## 2020-12-06 MED FILL — MELOXICAM 15 MG TABLET: 15 | 30 days supply | Qty: 30 | Fill #1

## 2020-12-06 MED FILL — CYCLOBENZAPRINE HCL 5 MG TA: 5 | 10 days supply | Qty: 30 | Fill #1

## 2021-01-06 ENCOUNTER — Other Ambulatory Visit (HOSPITAL_COMMUNITY): Payer: Self-pay | Admitting: Family Medicine

## 2021-01-06 MED FILL — CYCLOBENZAPRINE HCL 5 MG TA: 5 | 10 days supply | Qty: 30 | Fill #0

## 2021-01-06 MED FILL — MELOXICAM 15 MG TABLET: 15 | 30 days supply | Qty: 30 | Fill #0

## 2021-01-06 MED FILL — GABAPENTIN 300 MG CAPSULE: 300 | 90 days supply | Qty: 90 | Fill #1

## 2021-01-06 MED FILL — PANTOPRAZOLE SOD DR 20 MG T: 20 | 90 days supply | Qty: 90 | Fill #1

## 2021-02-20 DIAGNOSIS — H524 Presbyopia: Secondary | ICD-10-CM | POA: Diagnosis not present

## 2021-03-12 ENCOUNTER — Other Ambulatory Visit: Payer: Self-pay

## 2021-03-12 ENCOUNTER — Encounter: Payer: Self-pay | Admitting: Nurse Practitioner

## 2021-03-12 ENCOUNTER — Ambulatory Visit: Payer: 59 | Admitting: Nurse Practitioner

## 2021-03-12 VITALS — BP 124/80

## 2021-03-12 DIAGNOSIS — N9489 Other specified conditions associated with female genital organs and menstrual cycle: Secondary | ICD-10-CM | POA: Diagnosis not present

## 2021-03-12 DIAGNOSIS — N952 Postmenopausal atrophic vaginitis: Secondary | ICD-10-CM | POA: Diagnosis not present

## 2021-03-12 LAB — URINALYSIS, COMPLETE W/RFL CULTURE
Bacteria, UA: NONE SEEN /HPF
Bilirubin Urine: NEGATIVE
Glucose, UA: NEGATIVE
Hgb urine dipstick: NEGATIVE
Ketones, ur: NEGATIVE
Leukocyte Esterase: NEGATIVE
Nitrites, Initial: NEGATIVE
Protein, ur: NEGATIVE
RBC / HPF: NONE SEEN /HPF (ref 0–2)
Specific Gravity, Urine: 1.01 (ref 1.001–1.03)
WBC, UA: NONE SEEN /HPF (ref 0–5)
pH: 7 (ref 5.0–8.0)

## 2021-03-12 LAB — NO CULTURE INDICATED

## 2021-03-12 LAB — WET PREP FOR TRICH, YEAST, CLUE

## 2021-03-12 NOTE — Progress Notes (Signed)
   Acute Office Visit  Subjective:    Patient ID: Deborah Mercado, female    DOB: 1951/07/04, 70 y.o.   MRN: [id number]   HPI 70 y.o. presents today for vulvar burning and irritation. Denies discharge or odor, urinary frequency, urgency, or hematuria. 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 History of ER+ breast cancer. Not currently sexually active.    Review of Systems  Constitutional: Negative.   Genitourinary: Positive for vaginal pain. Negative for dysuria, frequency, hematuria, urgency, vaginal bleeding and vaginal discharge.       Objective:    Physical Exam Constitutional:      Appearance: Normal appearance.  Genitourinary:    Vagina: Normal. No vaginal discharge, erythema or tenderness.     Comments: Mild redness to inner labia minora. Atrophic changes present    BP 124/80   LMP 11/30/2004 (Approximate)  Wt Readings from Last 3 Encounters:  09/16/20 192 lb 3.2 oz (87.2 kg)  10/16/19 194 lb (88 kg)  09/14/19 192 lb 12.8 oz (87.5 kg)   UA negative Wet prep negative     Assessment & Plan:   Problem List Items Addressed This Visit   None   Visit Diagnoses    Post-menopausal atrophic vaginitis    -  Primary   Vulvar burning       Relevant Orders   Urinalysis,Complete w/RFL Culture (Completed)   WET PREP FOR [place], YEAST, CLUE     Plan: Reassurance provided on normal exam, wet prep, and urinalysis. Discussed menopausal atrophic vaginitis and management options. She has a history of ER+ breast cancer, so we will avoid vaginal estrogen. Recommend OTC maintenance lubricants and/or coconut oil applied externally. All questions answered. She is agreeable to plan.       Tamela Gammon DNP, 11:38 AM 03/12/2021

## 2021-03-12 NOTE — Patient Instructions (Signed)
Atrophic Vaginitis  Atrophic vaginitis is a condition in which the tissues that line the vagina become dry and thin. This condition is most common in women who have stopped having regular menstrual periods (are in menopause). This usually starts when a woman is 74 to 69 years old. That is the time when a woman's estrogen levels begin to decrease. Estrogen is a female hormone. It helps to keep the tissues of the vagina moist. It stimulates the vagina to produce a clear fluid that lubricates the vagina for sex. This fluid also protects the vagina from infection. Lack of estrogen can cause the lining of the vagina to get thinner and dryer. The vagina may also shrink in size. It may become less elastic. Atrophic vaginitis tends to get worse over time as a woman's estrogen level drops. What are the causes? This condition is caused by the normal drop in estrogen that happens around the time of menopause. What increases the risk? Certain conditions or situations may lower a woman's estrogen level, leading to a higher risk for atrophic vaginitis. You are more likely to develop this condition if:  You are taking medicines that block estrogen.  You have had your ovaries removed.  You are being treated for cancer with radiation or medicines (chemotherapy).  You have given birth or are breastfeeding.  You are older than age 68.  You smoke. What are the signs or symptoms? Symptoms of this condition include:  Pain, soreness, a feeling of pressure, or bleeding during sex (dyspareunia).  Vaginal burning, irritation, or itching.  Pain or bleeding when a speculum is used in a vaginal exam.  Having burning pain while urinating.  Vaginal discharge. In some cases, there are no symptoms. How is this diagnosed? This condition is diagnosed based on your medical history and a physical exam. This will include a pelvic exam that checks the vaginal tissues. Though rare, you may also have other tests,  including:  A urine test.  A test that checks the acid balance in your vagina (acid balance test). How is this treated? Treatment for this condition depends on how severe your symptoms are. Treatment may include:  Using an over-the-counter vaginal lubricant before sex.  Using a long-acting vaginal moisturizer.  Using low-dose estrogen for moderate to severe symptoms that do not respond to other treatments. Options include creams, tablets, and inserts (vaginal rings). Before you use a vaginal estrogen, tell your health care provider if you have a history of: ? Breast cancer. ? Endometrial cancer. ? Blood clots. If you are not sexually active and your symptoms are very mild, you may not need treatment. Follow these instructions at home: Medicines  Take over-the-counter and prescription medicines only as told by your health care provider.  Do not use herbal or alternative medicines unless your health care provider says that you can.  Use over-the-counter creams, lubricants, or moisturizers for dryness only as told by your health care provider. General instructions  If your atrophic vaginitis is caused by menopause, discuss all of your menopause symptoms and treatment options with your health care provider.  Do not douche.  Do not use products that can make your vagina dry. These include: ? Scented feminine sprays. ? Scented tampons. ? Scented soaps.  Vaginal sex can help to improve blood flow and elasticity of vaginal tissue. If you choose to have sex and it hurts, try using a water-soluble lubricant or moisturizer right before having sex. Contact a health care provider if:  Your discharge looks  different than normal.  Your vagina has an unusual smell.  You have new symptoms.  Your symptoms do not improve with treatment.  Your symptoms get worse. Summary  Atrophic vaginitis is a condition in which the tissues that line the vagina become dry and thin. It is most common  in women who have stopped having regular menstrual periods (are in menopause).  Treatment options include using vaginal lubricants and low-dose vaginal estrogen.  Contact a health care provider if your vagina has an unusual smell, or if your symptoms get worse or do not improve after treatment. This information is not intended to replace advice given to you by your health care provider. Make sure you discuss any questions you have with your health care provider. Document Revised: 05/16/2020 Document Reviewed: 05/16/2020 Elsevier Patient Education  [place].

## 2021-04-04 ENCOUNTER — Other Ambulatory Visit (HOSPITAL_COMMUNITY): Payer: Self-pay

## 2021-04-04 DIAGNOSIS — K219 Gastro-esophageal reflux disease without esophagitis: Secondary | ICD-10-CM | POA: Diagnosis not present

## 2021-04-04 DIAGNOSIS — R1031 Right lower quadrant pain: Secondary | ICD-10-CM | POA: Diagnosis not present

## 2021-04-04 DIAGNOSIS — I972 Postmastectomy lymphedema syndrome: Secondary | ICD-10-CM | POA: Diagnosis not present

## 2021-04-04 DIAGNOSIS — M545 Low back pain, unspecified: Secondary | ICD-10-CM | POA: Diagnosis not present

## 2021-04-04 DIAGNOSIS — N949 Unspecified condition associated with female genital organs and menstrual cycle: Secondary | ICD-10-CM | POA: Diagnosis not present

## 2021-04-04 MED ORDER — FLUCONAZOLE 150 MG PO TABS
ORAL_TABLET | ORAL | 0 refills | Status: DC
  Filled 2021-04-04: qty 2, 3d supply, fill #0

## 2021-04-04 MED ORDER — PANTOPRAZOLE SODIUM 20 MG PO TBEC
20.0000 mg | DELAYED_RELEASE_TABLET | Freq: Every day | ORAL | 3 refills | Status: DC
  Filled 2021-04-04: qty 90, 90d supply, fill #0
  Filled 2021-07-07: qty 90, 90d supply, fill #1
  Filled 2021-10-09: qty 90, 90d supply, fill #2
  Filled 2022-01-15: qty 90, 90d supply, fill #3

## 2021-04-04 MED ORDER — GABAPENTIN 300 MG PO CAPS
300.0000 mg | ORAL_CAPSULE | Freq: Every day | ORAL | 3 refills | Status: DC
  Filled 2021-04-04: qty 90, 90d supply, fill #0
  Filled 2021-07-07: qty 90, 90d supply, fill #1
  Filled 2021-10-03: qty 90, 90d supply, fill #2
  Filled 2021-12-31: qty 90, 90d supply, fill #3

## 2021-04-04 MED ORDER — CYCLOBENZAPRINE HCL 5 MG PO TABS
5.0000 mg | ORAL_TABLET | Freq: Three times a day (TID) | ORAL | 0 refills | Status: AC
  Filled 2021-04-04: qty 30, 10d supply, fill #0

## 2021-04-08 ENCOUNTER — Other Ambulatory Visit: Payer: Self-pay | Admitting: Family Medicine

## 2021-04-08 DIAGNOSIS — R1031 Right lower quadrant pain: Secondary | ICD-10-CM

## 2021-04-11 ENCOUNTER — Other Ambulatory Visit: Payer: Self-pay | Admitting: Family Medicine

## 2021-04-11 DIAGNOSIS — Z1231 Encounter for screening mammogram for malignant neoplasm of breast: Secondary | ICD-10-CM

## 2021-05-05 ENCOUNTER — Other Ambulatory Visit (HOSPITAL_COMMUNITY): Payer: Self-pay

## 2021-05-05 DIAGNOSIS — N898 Other specified noninflammatory disorders of vagina: Secondary | ICD-10-CM | POA: Diagnosis not present

## 2021-05-05 DIAGNOSIS — M545 Low back pain, unspecified: Secondary | ICD-10-CM | POA: Diagnosis not present

## 2021-05-05 MED ORDER — CYCLOBENZAPRINE HCL 5 MG PO TABS
5.0000 mg | ORAL_TABLET | Freq: Every evening | ORAL | 1 refills | Status: DC | PRN
  Filled 2021-05-05: qty 90, 90d supply, fill #0
  Filled 2021-08-05: qty 90, 90d supply, fill #1

## 2021-05-05 MED ORDER — FLUCONAZOLE 150 MG PO TABS
150.0000 mg | ORAL_TABLET | Freq: Every day | ORAL | 0 refills | Status: DC
  Filled 2021-05-05: qty 10, 50d supply, fill #0

## 2021-05-06 ENCOUNTER — Other Ambulatory Visit (HOSPITAL_COMMUNITY): Payer: Self-pay

## 2021-05-26 ENCOUNTER — Other Ambulatory Visit (HOSPITAL_COMMUNITY): Payer: Self-pay

## 2021-05-26 DIAGNOSIS — M545 Low back pain, unspecified: Secondary | ICD-10-CM | POA: Diagnosis not present

## 2021-05-26 DIAGNOSIS — Z1211 Encounter for screening for malignant neoplasm of colon: Secondary | ICD-10-CM | POA: Diagnosis not present

## 2021-05-26 DIAGNOSIS — Z13220 Encounter for screening for lipoid disorders: Secondary | ICD-10-CM | POA: Diagnosis not present

## 2021-05-26 DIAGNOSIS — Z124 Encounter for screening for malignant neoplasm of cervix: Secondary | ICD-10-CM | POA: Diagnosis not present

## 2021-05-26 DIAGNOSIS — K219 Gastro-esophageal reflux disease without esophagitis: Secondary | ICD-10-CM | POA: Diagnosis not present

## 2021-05-26 DIAGNOSIS — Z1231 Encounter for screening mammogram for malignant neoplasm of breast: Secondary | ICD-10-CM | POA: Diagnosis not present

## 2021-05-26 DIAGNOSIS — I972 Postmastectomy lymphedema syndrome: Secondary | ICD-10-CM | POA: Diagnosis not present

## 2021-05-26 DIAGNOSIS — Z0000 Encounter for general adult medical examination without abnormal findings: Secondary | ICD-10-CM | POA: Diagnosis not present

## 2021-05-26 MED ORDER — MELOXICAM 15 MG PO TABS
15.0000 mg | ORAL_TABLET | Freq: Every day | ORAL | 3 refills | Status: DC | PRN
  Filled 2021-05-26: qty 30, 30d supply, fill #0
  Filled 2021-08-25: qty 30, 30d supply, fill #1
  Filled 2021-12-11: qty 30, 30d supply, fill #2
  Filled 2022-01-13: qty 30, 30d supply, fill #3

## 2021-05-26 MED ORDER — CYCLOBENZAPRINE HCL 5 MG PO TABS
ORAL_TABLET | ORAL | 1 refills | Status: DC
  Filled 2021-05-26: qty 90, 90d supply, fill #0

## 2021-05-27 ENCOUNTER — Other Ambulatory Visit (HOSPITAL_COMMUNITY): Payer: Self-pay

## 2021-06-06 ENCOUNTER — Ambulatory Visit
Admission: RE | Admit: 2021-06-06 | Discharge: 2021-06-06 | Disposition: A | Discharge status: Home / Self Care | Payer: 59 | Source: Ambulatory Visit | Attending: Family Medicine | Admitting: Family Medicine

## 2021-06-06 ENCOUNTER — Other Ambulatory Visit: Payer: Self-pay

## 2021-06-06 DIAGNOSIS — Z1231 Encounter for screening mammogram for malignant neoplasm of breast: Secondary | ICD-10-CM

## 2021-07-07 ENCOUNTER — Other Ambulatory Visit (HOSPITAL_COMMUNITY): Payer: Self-pay

## 2021-08-05 ENCOUNTER — Other Ambulatory Visit (HOSPITAL_COMMUNITY): Payer: Self-pay

## 2021-08-25 ENCOUNTER — Other Ambulatory Visit (HOSPITAL_COMMUNITY): Payer: Self-pay

## 2021-10-03 ENCOUNTER — Other Ambulatory Visit (HOSPITAL_COMMUNITY): Payer: Self-pay

## 2021-10-09 ENCOUNTER — Other Ambulatory Visit (HOSPITAL_COMMUNITY): Payer: Self-pay

## 2021-11-07 ENCOUNTER — Other Ambulatory Visit (HOSPITAL_COMMUNITY): Payer: Self-pay

## 2021-11-07 MED ORDER — FLUCONAZOLE 150 MG PO TABS
ORAL_TABLET | ORAL | 0 refills | Status: DC
  Filled 2021-11-07: qty 10, 50d supply, fill #0

## 2021-12-11 ENCOUNTER — Other Ambulatory Visit (HOSPITAL_COMMUNITY): Payer: Self-pay

## 2021-12-31 ENCOUNTER — Other Ambulatory Visit (HOSPITAL_COMMUNITY): Payer: Self-pay

## 2022-01-13 ENCOUNTER — Other Ambulatory Visit (HOSPITAL_COMMUNITY): Payer: Self-pay

## 2022-01-14 DIAGNOSIS — R030 Elevated blood-pressure reading, without diagnosis of hypertension: Secondary | ICD-10-CM | POA: Diagnosis not present

## 2022-01-14 DIAGNOSIS — S39011A Strain of muscle, fascia and tendon of abdomen, initial encounter: Secondary | ICD-10-CM | POA: Diagnosis not present

## 2022-01-15 ENCOUNTER — Other Ambulatory Visit (HOSPITAL_COMMUNITY): Payer: Self-pay

## 2022-03-30 ENCOUNTER — Other Ambulatory Visit (HOSPITAL_COMMUNITY): Payer: Self-pay

## 2022-03-30 MED ORDER — GABAPENTIN 300 MG PO CAPS
300.0000 mg | ORAL_CAPSULE | Freq: Every day | ORAL | 0 refills | Status: DC
  Filled 2022-03-30: qty 90, 90d supply, fill #0

## 2022-03-30 MED ORDER — LISINOPRIL 10 MG PO TABS
10.0000 mg | ORAL_TABLET | Freq: Every day | ORAL | 1 refills | Status: DC
  Filled 2022-03-30: qty 30, 30d supply, fill #0

## 2022-04-06 DIAGNOSIS — M79671 Pain in right foot: Secondary | ICD-10-CM | POA: Diagnosis not present

## 2022-04-16 ENCOUNTER — Other Ambulatory Visit (HOSPITAL_COMMUNITY): Payer: Self-pay

## 2022-04-16 DIAGNOSIS — I10 Essential (primary) hypertension: Secondary | ICD-10-CM | POA: Diagnosis not present

## 2022-04-16 DIAGNOSIS — E785 Hyperlipidemia, unspecified: Secondary | ICD-10-CM | POA: Diagnosis not present

## 2022-04-16 DIAGNOSIS — I972 Postmastectomy lymphedema syndrome: Secondary | ICD-10-CM | POA: Diagnosis not present

## 2022-04-16 MED ORDER — LISINOPRIL 20 MG PO TABS
20.0000 mg | ORAL_TABLET | Freq: Every day | ORAL | 3 refills | Status: DC
  Filled 2022-04-16: qty 90, 90d supply, fill #0
  Filled 2022-06-30: qty 90, 90d supply, fill #1
  Filled 2022-09-28: qty 90, 90d supply, fill #2
  Filled 2022-12-28: qty 90, 90d supply, fill #3

## 2022-04-16 MED ORDER — GABAPENTIN 300 MG PO CAPS
300.0000 mg | ORAL_CAPSULE | Freq: Every day | ORAL | 3 refills | Status: DC
  Filled 2022-06-30: qty 90, 90d supply, fill #0
  Filled 2022-09-28: qty 90, 90d supply, fill #1
  Filled 2022-12-28: qty 90, 90d supply, fill #2
  Filled 2023-03-29: qty 90, 90d supply, fill #3

## 2022-04-17 ENCOUNTER — Other Ambulatory Visit (HOSPITAL_COMMUNITY): Payer: Self-pay

## 2022-04-17 MED ORDER — PANTOPRAZOLE SODIUM 20 MG PO TBEC
20.0000 mg | DELAYED_RELEASE_TABLET | Freq: Every day | ORAL | 3 refills | Status: DC
  Filled 2022-04-17: qty 90, 90d supply, fill #0
  Filled 2022-07-22: qty 90, 90d supply, fill #1
  Filled 2022-10-01: qty 90, 90d supply, fill #2
  Filled 2023-01-28: qty 90, 90d supply, fill #3

## 2022-04-17 MED ORDER — PANTOPRAZOLE SODIUM 20 MG PO TBEC: 20.0000 mg | DELAYED_RELEASE_TABLET | Freq: Every day | ORAL | 2 refills | Status: DC

## 2022-04-28 ENCOUNTER — Other Ambulatory Visit: Payer: Self-pay | Admitting: Family Medicine

## 2022-04-28 DIAGNOSIS — Z1231 Encounter for screening mammogram for malignant neoplasm of breast: Secondary | ICD-10-CM

## 2022-06-08 ENCOUNTER — Ambulatory Visit
Admission: RE | Admit: 2022-06-08 | Discharge: 2022-06-08 | Disposition: A | Discharge status: Home / Self Care | Payer: 59 | Source: Ambulatory Visit | Attending: Family Medicine | Admitting: Family Medicine

## 2022-06-08 DIAGNOSIS — Z1231 Encounter for screening mammogram for malignant neoplasm of breast: Secondary | ICD-10-CM

## 2022-06-30 ENCOUNTER — Other Ambulatory Visit (HOSPITAL_COMMUNITY): Payer: Self-pay

## 2022-07-06 ENCOUNTER — Other Ambulatory Visit (HOSPITAL_COMMUNITY): Payer: Self-pay

## 2022-07-06 DIAGNOSIS — R112 Nausea with vomiting, unspecified: Secondary | ICD-10-CM | POA: Diagnosis not present

## 2022-07-06 MED ORDER — LOPERAMIDE HCL 2 MG PO CAPS
2.0000 mg | ORAL_CAPSULE | Freq: Four times a day (QID) | ORAL | 0 refills | Status: DC
  Filled 2022-07-06: qty 40, 10d supply, fill #0

## 2022-07-06 MED ORDER — ONDANSETRON HCL 8 MG PO TABS
8.0000 mg | ORAL_TABLET | ORAL | 0 refills | Status: DC | PRN
  Filled 2022-07-06: qty 10, 10d supply, fill #0

## 2022-07-22 ENCOUNTER — Other Ambulatory Visit (HOSPITAL_COMMUNITY): Payer: Self-pay

## 2022-07-27 ENCOUNTER — Other Ambulatory Visit (HOSPITAL_COMMUNITY): Payer: Self-pay

## 2022-07-27 DIAGNOSIS — J069 Acute upper respiratory infection, unspecified: Secondary | ICD-10-CM | POA: Diagnosis not present

## 2022-07-27 MED ORDER — AZITHROMYCIN 250 MG PO TABS
ORAL_TABLET | ORAL | 0 refills | Status: AC
  Filled 2022-07-27: qty 6, 5d supply, fill #0

## 2022-07-27 MED ORDER — BENZONATATE 100 MG PO CAPS
100.0000 mg | ORAL_CAPSULE | Freq: Three times a day (TID) | ORAL | 0 refills | Status: DC
  Filled 2022-07-27: qty 30, 10d supply, fill #0

## 2022-08-06 ENCOUNTER — Other Ambulatory Visit (HOSPITAL_COMMUNITY): Payer: Self-pay

## 2022-08-06 MED ORDER — BENZONATATE 100 MG PO CAPS
100.0000 mg | ORAL_CAPSULE | Freq: Three times a day (TID) | ORAL | 0 refills | Status: DC
  Filled 2022-08-06: qty 30, 10d supply, fill #0

## 2022-09-28 ENCOUNTER — Other Ambulatory Visit (HOSPITAL_COMMUNITY): Payer: Self-pay

## 2022-10-01 ENCOUNTER — Other Ambulatory Visit (HOSPITAL_COMMUNITY): Payer: Self-pay

## 2022-10-20 ENCOUNTER — Other Ambulatory Visit (HOSPITAL_COMMUNITY): Payer: Self-pay

## 2022-10-20 DIAGNOSIS — Z1211 Encounter for screening for malignant neoplasm of colon: Secondary | ICD-10-CM | POA: Diagnosis not present

## 2022-10-20 DIAGNOSIS — R350 Frequency of micturition: Secondary | ICD-10-CM | POA: Diagnosis not present

## 2022-10-20 MED ORDER — CIPROFLOXACIN HCL 250 MG PO TABS
250.0000 mg | ORAL_TABLET | Freq: Two times a day (BID) | ORAL | 0 refills | Status: DC
  Filled 2022-10-20: qty 10, 5d supply, fill #0

## 2022-10-20 MED ORDER — PHENAZOPYRIDINE HCL 200 MG PO TABS
200.0000 mg | ORAL_TABLET | Freq: Three times a day (TID) | ORAL | 0 refills | Status: DC
  Filled 2022-10-20: qty 6, 2d supply, fill #0

## 2022-10-26 ENCOUNTER — Other Ambulatory Visit (HOSPITAL_COMMUNITY): Payer: Self-pay

## 2022-10-26 MED ORDER — CIPROFLOXACIN HCL 250 MG PO TABS
250.0000 mg | ORAL_TABLET | Freq: Two times a day (BID) | ORAL | 0 refills | Status: DC
  Filled 2022-10-26: qty 10, 5d supply, fill #0

## 2022-10-27 ENCOUNTER — Other Ambulatory Visit (HOSPITAL_COMMUNITY): Payer: Self-pay

## 2022-11-06 ENCOUNTER — Encounter: Payer: Self-pay | Admitting: Obstetrics & Gynecology

## 2022-11-06 ENCOUNTER — Ambulatory Visit (INDEPENDENT_AMBULATORY_CARE_PROVIDER_SITE_OTHER): Payer: Medicare PPO | Admitting: Obstetrics & Gynecology

## 2022-11-06 ENCOUNTER — Other Ambulatory Visit (HOSPITAL_COMMUNITY): Payer: Self-pay

## 2022-11-06 VITALS — BP 110/64 | HR 75

## 2022-11-06 DIAGNOSIS — N393 Stress incontinence (female) (male): Secondary | ICD-10-CM | POA: Diagnosis not present

## 2022-11-06 DIAGNOSIS — N898 Other specified noninflammatory disorders of vagina: Secondary | ICD-10-CM

## 2022-11-06 DIAGNOSIS — R208 Other disturbances of skin sensation: Secondary | ICD-10-CM | POA: Diagnosis not present

## 2022-11-06 LAB — WET PREP FOR TRICH, YEAST, CLUE

## 2022-11-06 MED ORDER — TERCONAZOLE 0.8 % VA CREA
1.0000 | TOPICAL_CREAM | Freq: Every day | VAGINAL | 0 refills | Status: AC
  Filled 2022-11-06: qty 20, 3d supply, fill #0

## 2022-11-06 MED ORDER — FLUCONAZOLE 150 MG PO TABS
150.0000 mg | ORAL_TABLET | ORAL | 0 refills | Status: AC
  Filled 2022-11-06: qty 3, 6d supply, fill #0

## 2022-11-06 NOTE — Progress Notes (Signed)
    Deborah Mercado 1950/12/05 [id number]        71 y.o.  G1P1001   RP: Rectal burning x 2 weeks  HPI: Treated with Ciprofloxacin for a UTI 2 weeks ago.  Since then, developed rectal burning and itching.  No rectal bleeding.  Cologard Neg recently.  No UTI Sxs since treatment.  Ongoing SUI. Wears a pad at all times.  No Kegels or PT done for the pelvic floor.  No pelvic pain.  No fever.     OB History  Gravida Para Term Preterm AB Living  '1 1 1 '$ 0 0 1  SAB IAB Ectopic Multiple Live Births  0 0 0 0 1    # Outcome Date GA Lbr Len/2nd Weight Sex Delivery Anes PTL Lv  1 Term 91    F Vag-Spont   LIV    Past medical history,surgical history, problem list, medications, allergies, family history and social history were all reviewed and documented in the EPIC chart.   Directed ROS with pertinent positives and negatives documented in the history of present illness/assessment and plan.  Exam:  Vitals:   11/06/22 1022  BP: 110/64  Pulse: 75  SpO2: 99%   General appearance:  Normal  Abdomen: Normal  Gynecologic exam: Vulva: Thick white discharge.  Wet prep done on vulvar and vaginal discharge.                                 Rectum:  Mildly irritated, no discrete lesion.  Wet prep:  Yeasts present   Assessment/Plan:  71 y.o. G1P1001   1. Rectal burning Treated with Ciprofloxacin for a UTI 2 weeks ago.  Since then, developed rectal burning and itching.  No rectal bleeding.  Cologard Neg recently.  No UTI Sxs since treatment.  Ongoing SUI. Wears a pad at all times.  No Kegels or PT done for the pelvic floor.  No pelvic pain.  No fever.  Wet prep confirming a yeast infection.  Will treat with Terazol 3 and Fluconazole. - WET PREP FOR [place], YEAST, CLUE  2. Vaginal discharge Yeast vaginitis confirmed by Wet prep. - WET PREP FOR [place], YEAST, CLUE  3. SUI (stress urinary incontinence, female) Kegel exercises instructed.  If no improvement at 6 months of doing 10 series of  10 Kegels every day, will refer to PT for pelvic floor reinforcement.  Other orders - terconazole (TERAZOL 3) 0.8 % vaginal cream; Place 1 applicator vaginally at bedtime for 3 days. - fluconazole (DIFLUCAN) 150 MG tablet; Take 1 tablet (150 mg total) by mouth every other day for 3 doses.    Princess Bruins MD, 10:53 AM 11/06/2022

## 2022-11-13 ENCOUNTER — Encounter: Payer: Self-pay | Admitting: Obstetrics & Gynecology

## 2022-11-13 ENCOUNTER — Ambulatory Visit (INDEPENDENT_AMBULATORY_CARE_PROVIDER_SITE_OTHER): Payer: Medicare PPO | Admitting: Obstetrics & Gynecology

## 2022-11-13 VITALS — BP 110/80 | HR 70 | Temp 97.3°F

## 2022-11-13 DIAGNOSIS — R102 Pelvic and perineal pain: Secondary | ICD-10-CM

## 2022-11-13 LAB — CBC WITH DIFFERENTIAL/PLATELET
Absolute Monocytes: 378 cells/uL (ref 200–950)
Basophils Absolute: 42 cells/uL (ref 0–200)
Basophils Relative: 0.7 %
Eosinophils Absolute: 60 cells/uL (ref 15–500)
Eosinophils Relative: 1 %
HCT: 39.8 % (ref 35.0–45.0)
Hemoglobin: 13.3 g/dL (ref 11.7–15.5)
Lymphs Abs: 1854 cells/uL (ref 850–3900)
MCH: 28 pg (ref 27.0–33.0)
MCHC: 33.4 g/dL (ref 32.0–36.0)
MCV: 83.8 fL (ref 80.0–100.0)
MPV: 11.7 fL (ref 7.5–12.5)
Monocytes Relative: 6.3 %
Neutro Abs: 3666 cells/uL (ref 1500–7800)
Neutrophils Relative %: 61.1 %
Platelets: 191 10*3/uL (ref 140–400)
RBC: 4.75 10*6/uL (ref 3.80–5.10)
RDW: 12.9 % (ref 11.0–15.0)
Total Lymphocyte: 30.9 %
WBC: 6 10*3/uL (ref 3.8–10.8)

## 2022-11-13 NOTE — Progress Notes (Signed)
    Deborah Mercado 03/29/1951 [id number]        71 y.o.  G1P1L1   RP: Rt pelvic discomfort/tenderness x 2 days  HPI: Seen a week ago for perianal burning, yeasts confirmed on wet prep, treatment with Terazol 3 and Fluconazole was effective, no pelvic pain at that time.  C/O Rt lower abdominal discomfort and tenderness intermittently.  No severe pain, no pain meds taken.  No fall.  No notion of anything different she may have done before the pain started.  No PMB.  No vaginal discharge.  No UTI Sx.  BMs normal.  No fever.   OB History  Gravida Para Term Preterm AB Living  '1 1 1 '$ 0 0 1  SAB IAB Ectopic Multiple Live Births  0 0 0 0 1    # Outcome Date GA Lbr Len/2nd Weight Sex Delivery Anes PTL Lv  1 Term 78    F Vag-Spont   LIV    Past medical history,surgical history, problem list, medications, allergies, family history and social history were all reviewed and documented in the EPIC chart.   Directed ROS with pertinent positives and negatives documented in the history of present illness/assessment and plan.  Exam:  Vitals:   11/13/22 1327  BP: 110/80  Pulse: 70  Temp: (!) 97.3 F (36.3 C)  TempSrc: Oral  SpO2: 98%   General appearance:  Normal, not appearing in pain.  Abdomen: Soft, not distended.  No evidence of hernia with head lift.  Very mildly tender at the Rt lower abdominal wall.  No rebound.  No mass felt.  Gynecologic exam: Vulva normal.  Bimanual exam:  Uterus AV, normal volume, mobile, NT.  No adnexal mass, NT.  U/A: Yellow, slightly cloudy, Nit Neg, WBC 0-5, RBC 0-2, Bacteria Few.   Assessment/Plan:  71 y.o. G1P1001   1. Pelvic pain Seen a week ago for perianal burning, yeasts confirmed on wet prep, treatment with Terazol 3 and Fluconazole was effective, no pelvic pain at that time.  C/O Rt lower abdominal discomfort and tenderness intermittently.  No severe pain, no pain meds taken.  No fall.  No notion of anything different she may have done  before the pain started.  No PMB.  No vaginal discharge.  No UTI Sx.  BMs normal.  No fever.  Normal abdominal/gyn exam.  Abdominal wall tenderness?  No evidence of hernia.  CBC/Diff today.  Ibuprofen regular x 1-2 days. Will see her Primary PA if worsening pain. - Urinalysis,Complete w/RFL Culture - CBC w/Diff   Princess Bruins MD, 1:35 PM 11/13/2022

## 2022-11-14 LAB — URINALYSIS, COMPLETE W/RFL CULTURE
Bilirubin Urine: NEGATIVE
Glucose, UA: NEGATIVE
Hyaline Cast: NONE SEEN /LPF
Leukocyte Esterase: NEGATIVE
Nitrites, Initial: NEGATIVE
Protein, ur: NEGATIVE
Specific Gravity, Urine: 1.02 (ref 1.001–1.035)
pH: 7 (ref 5.0–8.0)

## 2022-11-14 LAB — URINE CULTURE
MICRO NUMBER:: 14321343
SPECIMEN QUALITY:: ADEQUATE

## 2022-11-14 LAB — CULTURE INDICATED

## 2022-12-28 ENCOUNTER — Other Ambulatory Visit (HOSPITAL_COMMUNITY): Payer: Self-pay

## 2023-01-05 ENCOUNTER — Other Ambulatory Visit (HOSPITAL_COMMUNITY): Payer: Self-pay

## 2023-01-05 DIAGNOSIS — J40 Bronchitis, not specified as acute or chronic: Secondary | ICD-10-CM | POA: Diagnosis not present

## 2023-01-05 MED ORDER — BENZONATATE 200 MG PO CAPS
200.0000 mg | ORAL_CAPSULE | Freq: Every evening | ORAL | 0 refills | Status: DC | PRN
  Filled 2023-01-05: qty 15, 15d supply, fill #0

## 2023-01-05 MED ORDER — AZITHROMYCIN 250 MG PO TABS
ORAL_TABLET | ORAL | 0 refills | Status: DC
  Filled 2023-01-05: qty 6, 5d supply, fill #0

## 2023-01-08 ENCOUNTER — Other Ambulatory Visit (HOSPITAL_COMMUNITY): Payer: Self-pay

## 2023-01-28 ENCOUNTER — Other Ambulatory Visit (HOSPITAL_COMMUNITY): Payer: Self-pay

## 2023-03-17 ENCOUNTER — Other Ambulatory Visit (HOSPITAL_COMMUNITY): Payer: Self-pay

## 2023-03-17 DIAGNOSIS — R1030 Lower abdominal pain, unspecified: Secondary | ICD-10-CM | POA: Diagnosis not present

## 2023-03-17 MED ORDER — AMOXICILLIN-POT CLAVULANATE 875-125 MG PO TABS
1.0000 | ORAL_TABLET | Freq: Two times a day (BID) | ORAL | 0 refills | Status: DC
  Filled 2023-03-17: qty 14, 7d supply, fill #0

## 2023-03-18 ENCOUNTER — Other Ambulatory Visit (HOSPITAL_COMMUNITY): Payer: Self-pay

## 2023-03-29 ENCOUNTER — Other Ambulatory Visit (HOSPITAL_COMMUNITY): Payer: Self-pay

## 2023-03-29 ENCOUNTER — Other Ambulatory Visit: Payer: Self-pay

## 2023-03-29 MED ORDER — LISINOPRIL 20 MG PO TABS
20.0000 mg | ORAL_TABLET | Freq: Every day | ORAL | 1 refills | Status: DC
  Filled 2023-03-29: qty 90, 90d supply, fill #0
  Filled 2023-07-05: qty 90, 90d supply, fill #1

## 2023-04-06 ENCOUNTER — Other Ambulatory Visit: Payer: Self-pay | Admitting: Family Medicine

## 2023-04-06 DIAGNOSIS — Z1231 Encounter for screening mammogram for malignant neoplasm of breast: Secondary | ICD-10-CM

## 2023-04-08 NOTE — Progress Notes (Signed)
72 y.o. G6P1001 Divorced Caucasian female here for annual exam.   Dull ache in right lower quadrant for a month or so.  Feels worse when she lays down.  Takes ibuprofen 400 mg which helps.  Uses it twice a week.   No vaginal bleeding.  No constipation.   No dysuria or hematuria.   Urinary frequency.  Off Vesicare.   Just retired from [employer] after 21 years.   PCP:   Tera Helper, PA-C  Patient's last menstrual period was 11/30/2004 (approximate).           Sexually active: No.   Not sexually active since 2010.  The current method of family planning is post menopausal status.    Exercising: Yes.     walking Smoker:  no  Health Maintenance: Pap:  04/06/16 neg, neg HR HPV History of abnormal Pap:  no MMG:  06/08/22 Breast Density Cat B, BI-RADS CAT 1 neg.   Colonoscopy:  cologuard 2024 - normal per patient - done with PCP BMD:   04/06/16  Result  osteopenia of hip.  She declines BMD testing.  TDaP:  12/01/07 - she states she is up to date [facility] Gardasil:   no HIV: 04/06/16 NR Hep C: 04/06/16 neg Screening Labs: PCP   reports that she has never smoked. She has never used smokeless tobacco. She reports that she does not drink alcohol and does not use drugs.  Past Medical History:  Diagnosis Date   Allergy    Breast cancer ([facility]) 04/01/15   Right breast Invasive ductl carcinoma   GERD (gastroesophageal reflux disease)    Personal history of radiation therapy 2016   SUI (stress urinary incontinence, female)    Vitamin D deficiency     Past Surgical History:  Procedure Laterality Date   BREAST BIOPSY Right 04/01/2015   malignant   BREAST LUMPECTOMY Right 04/19/2015   BREAST LUMPECTOMY WITH RADIOACTIVE SEED AND SENTINEL LYMPH NODE BIOPSY Right 04/19/2015   Procedure: BREAST LUMPECTOMY WITH RADIOACTIVE SEED AND SENTINEL LYMPH NODE MAPPING;  Surgeon: Chevis Pretty III, MD;  Location: [facility];  Service: General;  Laterality: Right;    Current  Outpatient Medications  Medication Sig Dispense Refill   gabapentin (NEURONTIN) 300 MG capsule Take 1 capsule (300 mg total) by mouth daily. 90 capsule 4   gabapentin (NEURONTIN) 300 MG capsule Take 1 capsule (300 mg total) by mouth daily. 90 capsule 3   lisinopril (ZESTRIL) 20 MG tablet Take 1 tablet (20 mg total) by mouth daily. 90 tablet 1   pantoprazole (PROTONIX) 20 MG tablet Take 1 tablet (20 mg total) by mouth daily. 90 tablet 3   No current facility-administered medications for this visit.    Family History  Problem Relation Age of Onset   Heart disease Mother    Rheum arthritis Mother    Hypertension Mother    Rheum arthritis Maternal Grandmother    Heart attack Maternal Grandmother    Heart disease Maternal Grandmother    Breast cancer Cousin     Review of Systems  All other systems reviewed and are negative.   Exam:   BP 120/84 (BP Location: Left Arm, Patient Position: Sitting, Cuff Size: Large)   Pulse (!) 56   Ht 5\' 4"  (1.626 m)   Wt 179 lb (81.2 kg)   LMP 11/30/2004 (Approximate)   SpO2 98%   BMI 30.73 kg/m     General appearance: alert, cooperative and appears stated age Head: normocephalic, without  obvious abnormality, atraumatic Neck: no adenopathy, supple, symmetrical, trachea midline and thyroid normal to inspection and palpation Lungs: clear to auscultation bilaterally Breasts: normal appearance, no masses or tenderness, No nipple retraction or dimpling, No nipple discharge or bleeding, No axillary adenopathy Heart: regular rate and rhythm Abdomen: soft, non-tender; no masses, no organomegaly Extremities: extremities normal, atraumatic, no cyanosis or edema Skin: skin color, texture, turgor normal. No rashes or lesions Lymph nodes: cervical, supraclavicular, and axillary nodes normal. Neurologic: grossly normal  Pelvic: External genitalia:  no lesions              No abnormal inguinal nodes palpated.              Urethra:  normal appearing urethra  with no masses, tenderness or lesions              Bartholins and Skenes: normal                 Vagina: normal appearing vagina with normal color and discharge, no lesions              Cervix: no lesions              Pap taken: yes Bimanual Exam:  Uterus:  normal size, contour, position, consistency, mobility, non-tender              Adnexa: no mass, fullness, tenderness on left.  Mild right adnexal tenderness and no mass palpated.              Rectal exam: no.  Confirms.              Anus:  normal sphincter tone, no lesions  Chaperone was present for exam:  Warren Lacy, CMA  Assessment:   Well woman visit with gynecologic exam. Hx right breast cancer.  Cervical cancer screening. RLQ pain.  No acute abdomen.  Osteopenia of hip.  Plan: Mammogram screening discussed. Self breast awareness reviewed. Pap and reflex HR HPV collected. Guidelines for Calcium, Vitamin D, regular exercise program including cardiovascular and weight bearing exercise. Patient will do trail of Colace or Peri-colace to see if this improves her abdominal pain.  If not, she will return for pelvic ultrasound, which she is currently declining.  She declines follow up bone density at this time. Follow up annually and prn.   After visit summary provided.   25 min  total time was spent for this patient encounter, including preparation, face-to-face counseling with the patient, coordination of care, and documentation of the encounter in addition to doing breast and pelvic exam and pap.

## 2023-04-22 ENCOUNTER — Other Ambulatory Visit (HOSPITAL_COMMUNITY)
Admission: RE | Admit: 2023-04-22 | Discharge: 2023-04-22 | Disposition: A | Discharge status: Home / Self Care | Payer: Medicare PPO | Source: Ambulatory Visit | Attending: Obstetrics and Gynecology | Admitting: Obstetrics and Gynecology

## 2023-04-22 ENCOUNTER — Ambulatory Visit (INDEPENDENT_AMBULATORY_CARE_PROVIDER_SITE_OTHER): Payer: Medicare PPO | Admitting: Obstetrics and Gynecology

## 2023-04-22 ENCOUNTER — Encounter: Payer: Self-pay | Admitting: Obstetrics and Gynecology

## 2023-04-22 VITALS — BP 120/84 | HR 56 | Ht 64.0 in | Wt 179.0 lb

## 2023-04-22 DIAGNOSIS — Z124 Encounter for screening for malignant neoplasm of cervix: Secondary | ICD-10-CM | POA: Insufficient documentation

## 2023-04-22 DIAGNOSIS — M85859 Other specified disorders of bone density and structure, unspecified thigh: Secondary | ICD-10-CM | POA: Diagnosis not present

## 2023-04-22 DIAGNOSIS — R1031 Right lower quadrant pain: Secondary | ICD-10-CM | POA: Diagnosis not present

## 2023-04-22 DIAGNOSIS — Z79811 Long term (current) use of aromatase inhibitors: Secondary | ICD-10-CM

## 2023-04-22 DIAGNOSIS — C50411 Malignant neoplasm of upper-outer quadrant of right female breast: Secondary | ICD-10-CM | POA: Diagnosis not present

## 2023-04-22 DIAGNOSIS — Z9189 Other specified personal risk factors, not elsewhere classified: Secondary | ICD-10-CM | POA: Diagnosis not present

## 2023-04-22 DIAGNOSIS — Z01419 Encounter for gynecological examination (general) (routine) without abnormal findings: Secondary | ICD-10-CM

## 2023-04-22 NOTE — Patient Instructions (Signed)

## 2023-04-27 ENCOUNTER — Other Ambulatory Visit (HOSPITAL_COMMUNITY): Payer: Self-pay

## 2023-04-28 ENCOUNTER — Other Ambulatory Visit (HOSPITAL_COMMUNITY): Payer: Self-pay

## 2023-04-28 LAB — CYTOLOGY - PAP
Adequacy: ABSENT
Diagnosis: NEGATIVE

## 2023-04-28 MED ORDER — MELOXICAM 15 MG PO TABS
15.0000 mg | ORAL_TABLET | Freq: Every day | ORAL | 0 refills | Status: DC | PRN
  Filled 2023-04-28: qty 30, 30d supply, fill #0

## 2023-04-30 ENCOUNTER — Other Ambulatory Visit (HOSPITAL_COMMUNITY): Payer: Self-pay

## 2023-04-30 MED ORDER — PANTOPRAZOLE SODIUM 20 MG PO TBEC
20.0000 mg | DELAYED_RELEASE_TABLET | Freq: Every day | ORAL | 0 refills | Status: DC
  Filled 2023-04-30: qty 90, 90d supply, fill #0

## 2023-05-28 ENCOUNTER — Other Ambulatory Visit: Payer: Self-pay

## 2023-05-28 DIAGNOSIS — R102 Pelvic and perineal pain: Secondary | ICD-10-CM

## 2023-06-10 ENCOUNTER — Ambulatory Visit
Admission: RE | Admit: 2023-06-10 | Discharge: 2023-06-10 | Disposition: A | Discharge status: Home / Self Care | Payer: Medicare PPO | Source: Ambulatory Visit | Attending: Family Medicine | Admitting: Family Medicine

## 2023-06-10 DIAGNOSIS — Z1231 Encounter for screening mammogram for malignant neoplasm of breast: Secondary | ICD-10-CM

## 2023-06-16 NOTE — Progress Notes (Deleted)
GYNECOLOGY  VISIT   HPI: 72 y.o.   Divorced  Caucasian  female   G1P1001 with Patient's last menstrual period was 11/30/2004 (approximate).   here for   U/S consult  GYNECOLOGIC HISTORY: Patient's last menstrual period was 11/30/2004 (approximate). Contraception:  PMP Menopausal hormone therapy:  n/a Last mammogram:  06/10/23 Breast Density Cat B, BI-RADS CAT 2 benign Last pap smear:   04/22/23 neg        OB History     Gravida  1   Para  1   Term  1   Preterm  0   AB  0   Living  1      SAB  0   IAB  0   Ectopic  0   Multiple  0   Live Births  1              Patient Active Problem List   Diagnosis Date Noted   Hypertension 09/13/2018   Malignant neoplasm of upper-outer quadrant of right breast in female, estrogen receptor positive (HCC) 04/04/2015   GERD (gastroesophageal reflux disease) 02/19/2014   Laboratory examination ordered as part of a routine general medical examination 02/19/2014   Unspecified vitamin D deficiency 02/19/2014   Urge incontinence 02/19/2014    Past Medical History:  Diagnosis Date   Allergy    Breast cancer (HCC) 04/01/15   Right breast Invasive ductl carcinoma   GERD (gastroesophageal reflux disease)    Personal history of radiation therapy 2016   SUI (stress urinary incontinence, female)    Vitamin D deficiency     Past Surgical History:  Procedure Laterality Date   BREAST BIOPSY Right 04/01/2015   malignant   BREAST LUMPECTOMY Right 04/19/2015   BREAST LUMPECTOMY WITH RADIOACTIVE SEED AND SENTINEL LYMPH NODE BIOPSY Right 04/19/2015   Procedure: BREAST LUMPECTOMY WITH RADIOACTIVE SEED AND SENTINEL LYMPH NODE MAPPING;  Surgeon: Chevis Pretty III, MD;  Location: [facility];  Service: General;  Laterality: Right;    Current Outpatient Medications  Medication Sig Dispense Refill   gabapentin (NEURONTIN) 300 MG capsule Take 1 capsule (300 mg total) by mouth daily. 90 capsule 4   gabapentin (NEURONTIN) 300 MG  capsule Take 1 capsule (300 mg total) by mouth daily. 90 capsule 3   lisinopril (ZESTRIL) 20 MG tablet Take 1 tablet (20 mg total) by mouth daily. 90 tablet 1   meloxicam (MOBIC) 15 MG tablet Take 1 tablet (15 mg total) by mouth daily as needed. 30 tablet 0   pantoprazole (PROTONIX) 20 MG tablet Take 1 tablet (20 mg total) by mouth daily. 90 tablet 0   No current facility-administered medications for this visit.     ALLERGIES: Influenza vaccines and Monistat [miconazole]  Family History  Problem Relation Age of Onset   Heart disease Mother    Rheum arthritis Mother    Hypertension Mother    Rheum arthritis Maternal Grandmother    Heart attack Maternal Grandmother    Heart disease Maternal Grandmother    Breast cancer Cousin     Social History   Socioeconomic History   Marital status: Divorced    Spouse name: Not on file   Number of children: 1   Years of education: Not on file   Highest education level: Not on file  Occupational History   Occupation: PBX Designer, television/film set    Employer: [employer]  Tobacco Use   Smoking status: Never   Smokeless tobacco: Never  Vaping Use   Vaping  status: Never Used  Substance and Sexual Activity   Alcohol use: No   Drug use: No   Sexual activity: Not Currently    Partners: Male    Birth control/protection: Post-menopausal, Abstinence    Comment: <5, > 64 y/o, no STD  Other Topics Concern   Not on file  Social History Narrative   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   Social Determinants of Health   Financial Resource Strain: Not on file  Food Insecurity: Not on file  Transportation Needs: Not on file  Physical Activity: Not on file  Stress: Not on file  Social Connections: Not on file  Intimate Partner Violence: Not on file    Review of Systems  PHYSICAL EXAMINATION:    LMP 11/30/2004 (Approximate)     General appearance: alert, cooperative and appears stated age Head: Normocephalic, without obvious abnormality,  atraumatic Neck: no adenopathy, supple, symmetrical, trachea midline and thyroid normal to inspection and palpation Lungs: clear to auscultation bilaterally Breasts: normal appearance, no masses or tenderness, No nipple retraction or dimpling, No nipple discharge or bleeding, No axillary or supraclavicular adenopathy Heart: regular rate and rhythm Abdomen: soft, non-tender, no masses,  no organomegaly Extremities: extremities normal, atraumatic, no cyanosis or edema Skin: Skin color, texture, turgor normal. No rashes or lesions Lymph nodes: Cervical, supraclavicular, and axillary nodes normal. No abnormal inguinal nodes palpated Neurologic: Grossly normal  Pelvic: External genitalia:  no lesions              Urethra:  normal appearing urethra with no masses, tenderness or lesions              Bartholins and Skenes: normal                 Vagina: normal appearing vagina with normal color and discharge, no lesions              Cervix: no lesions                Bimanual Exam:  Uterus:  normal size, contour, position, consistency, mobility, non-tender              Adnexa: no mass, fullness, tenderness              Rectal exam: {yes no:[id number]}.  Confirms.              Anus:  normal sphincter tone, no lesions  Chaperone was present for exam:  ***  ASSESSMENT     PLAN     An After Visit Summary was printed and given to the patient.  ______ minutes face to face time of which over 50% was spent in counseling.

## 2023-06-22 ENCOUNTER — Ambulatory Visit (INDEPENDENT_AMBULATORY_CARE_PROVIDER_SITE_OTHER): Payer: Medicare PPO

## 2023-06-22 ENCOUNTER — Encounter: Payer: Self-pay | Admitting: Obstetrics and Gynecology

## 2023-06-22 ENCOUNTER — Other Ambulatory Visit: Payer: Medicare PPO

## 2023-06-22 ENCOUNTER — Other Ambulatory Visit: Payer: Medicare PPO | Admitting: Obstetrics and Gynecology

## 2023-06-22 ENCOUNTER — Ambulatory Visit: Payer: Medicare PPO | Admitting: Obstetrics and Gynecology

## 2023-06-22 VITALS — BP 126/78 | HR 63

## 2023-06-22 DIAGNOSIS — R102 Pelvic and perineal pain: Secondary | ICD-10-CM | POA: Diagnosis not present

## 2023-06-22 DIAGNOSIS — R1031 Right lower quadrant pain: Secondary | ICD-10-CM

## 2023-06-22 NOTE — Progress Notes (Signed)
GYNECOLOGY  VISIT   HPI: 72 y.o.   Divorced  Caucasian  female   G1P1001 with Patient's last menstrual period was 11/30/2004 (approximate).   here for   U/S consult for RLQ pain.  Still has a dull ache, worse when she lays down.  [employer], and this helped some.   GYNECOLOGIC HISTORY: Patient's last menstrual period was 11/30/2004 (approximate). Contraception:  PMP Menopausal hormone therapy:  n/a Last mammogram:  06/10/23 Breast Density Cat B, BI-RADS CAT 2 benign Last pap smear:   04/22/23 neg        OB History     Gravida  1   Para  1   Term  1   Preterm  0   AB  0   Living  1      SAB  0   IAB  0   Ectopic  0   Multiple  0   Live Births  1              Patient Active Problem List   Diagnosis Date Noted   Hypertension 09/13/2018   Malignant neoplasm of upper-outer quadrant of right breast in female, estrogen receptor positive (HCC) 04/04/2015   GERD (gastroesophageal reflux disease) 02/19/2014   Laboratory examination ordered as part of a routine general medical examination 02/19/2014   Unspecified vitamin D deficiency 02/19/2014   Urge incontinence 02/19/2014    Past Medical History:  Diagnosis Date   Allergy    Breast cancer (HCC) 04/01/15   Right breast Invasive ductl carcinoma   GERD (gastroesophageal reflux disease)    Personal history of radiation therapy 2016   SUI (stress urinary incontinence, female)    Vitamin D deficiency     Past Surgical History:  Procedure Laterality Date   BREAST BIOPSY Right 04/01/2015   malignant   BREAST LUMPECTOMY Right 04/19/2015   BREAST LUMPECTOMY WITH RADIOACTIVE SEED AND SENTINEL LYMPH NODE BIOPSY Right 04/19/2015   Procedure: BREAST LUMPECTOMY WITH RADIOACTIVE SEED AND SENTINEL LYMPH NODE MAPPING;  Surgeon: Chevis Pretty III, MD;  Location: [facility];  Service: General;  Laterality: Right;    Current Outpatient Medications  Medication Sig Dispense Refill   gabapentin (NEURONTIN) 300  MG capsule Take 1 capsule (300 mg total) by mouth daily. 90 capsule 4   gabapentin (NEURONTIN) 300 MG capsule Take 1 capsule (300 mg total) by mouth daily. 90 capsule 3   lisinopril (ZESTRIL) 20 MG tablet Take 1 tablet (20 mg total) by mouth daily. 90 tablet 1   meloxicam (MOBIC) 15 MG tablet Take 1 tablet (15 mg total) by mouth daily as needed. 30 tablet 0   pantoprazole (PROTONIX) 20 MG tablet Take 1 tablet (20 mg total) by mouth daily. 90 tablet 0   No current facility-administered medications for this visit.     ALLERGIES: Influenza vaccines and Monistat [miconazole]  Family History  Problem Relation Age of Onset   Heart disease Mother    Rheum arthritis Mother    Hypertension Mother    Rheum arthritis Maternal Grandmother    Heart attack Maternal Grandmother    Heart disease Maternal Grandmother    Breast cancer Cousin     Social History   Socioeconomic History   Marital status: Divorced    Spouse name: Not on file   Number of children: 1   Years of education: Not on file   Highest education level: Not on file  Occupational History   Occupation: Clinical cytogeneticist  Employer: [employer]  Tobacco Use   Smoking status: Never   Smokeless tobacco: Never  Vaping Use   Vaping status: Never Used  Substance and Sexual Activity   Alcohol use: No   Drug use: No   Sexual activity: Not Currently    Partners: Male    Birth control/protection: Post-menopausal, Abstinence    Comment: <5, > 17 y/o, no STD  Other Topics Concern   Not on file  Social History Narrative   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   Social Determinants of Health   Financial Resource Strain: Not on file  Food Insecurity: Not on file  Transportation Needs: Not on file  Physical Activity: Not on file  Stress: Not on file  Social Connections: Not on file  Intimate Partner Violence: Not on file    Review of Systems  All other systems reviewed and are negative.   PHYSICAL EXAMINATION:     BP 126/78 (BP Location: Left Arm, Patient Position: Sitting, Cuff Size: Normal)   Pulse 63   LMP 11/30/2004 (Approximate)   SpO2 99%     General appearance: alert, cooperative and appears stated age   Pelvic US Uterus 4.11 x 2.42 x 2.54 cm.  No masses. EMS 2.26 mm.  Left ovary 1.21 x 1.02 x 1.07 cm.  Right ovary 1.12 x 0.78 x 0.78 cm.  No adnexal masses.  No free fluid.  ASSESSMENT  RLQ pain.  Uncertain etiology.  I do not think this is gynecologic in origin.   PLAN  Pelvic US images and report reviewed.  I recommend she continue the Sennakot twice weekly.  Consider heating pad.  She will follow up with her PCP.  Follow up here prn.    13 min  total time was spent for this patient encounter, including preparation, face-to-face counseling with the patient, coordination of care, and documentation of the encounter.

## 2023-06-24 ENCOUNTER — Other Ambulatory Visit (HOSPITAL_COMMUNITY): Payer: Self-pay

## 2023-06-24 DIAGNOSIS — M25551 Pain in right hip: Secondary | ICD-10-CM | POA: Diagnosis not present

## 2023-06-24 MED ORDER — TIZANIDINE HCL 4 MG PO TABS
4.0000 mg | ORAL_TABLET | Freq: Every evening | ORAL | 1 refills | Status: DC | PRN
  Filled 2023-06-24: qty 30, 30d supply, fill #0

## 2023-06-24 MED ORDER — GABAPENTIN 300 MG PO CAPS
300.0000 mg | ORAL_CAPSULE | Freq: Every day | ORAL | 3 refills | Status: AC
  Filled 2023-06-24: qty 180, 90d supply, fill #0
  Filled 2023-10-01: qty 180, 90d supply, fill #1
  Filled 2024-01-10: qty 180, 90d supply, fill #2
  Filled 2024-04-07: qty 180, 90d supply, fill #3

## 2023-06-24 MED ORDER — MELOXICAM 15 MG PO TABS
15.0000 mg | ORAL_TABLET | Freq: Every day | ORAL | 1 refills | Status: DC
  Filled 2023-06-24: qty 30, 30d supply, fill #0
  Filled 2023-10-01: qty 30, 30d supply, fill #1

## 2023-07-05 ENCOUNTER — Other Ambulatory Visit (HOSPITAL_COMMUNITY): Payer: Self-pay

## 2023-07-16 ENCOUNTER — Other Ambulatory Visit (HOSPITAL_COMMUNITY): Payer: Self-pay

## 2023-07-16 DIAGNOSIS — I972 Postmastectomy lymphedema syndrome: Secondary | ICD-10-CM | POA: Diagnosis not present

## 2023-07-16 DIAGNOSIS — E559 Vitamin D deficiency, unspecified: Secondary | ICD-10-CM | POA: Diagnosis not present

## 2023-07-16 DIAGNOSIS — Z1211 Encounter for screening for malignant neoplasm of colon: Secondary | ICD-10-CM | POA: Diagnosis not present

## 2023-07-16 DIAGNOSIS — K219 Gastro-esophageal reflux disease without esophagitis: Secondary | ICD-10-CM | POA: Diagnosis not present

## 2023-07-16 DIAGNOSIS — Z0000 Encounter for general adult medical examination without abnormal findings: Secondary | ICD-10-CM | POA: Diagnosis not present

## 2023-07-16 DIAGNOSIS — I10 Essential (primary) hypertension: Secondary | ICD-10-CM | POA: Diagnosis not present

## 2023-07-16 DIAGNOSIS — E785 Hyperlipidemia, unspecified: Secondary | ICD-10-CM | POA: Diagnosis not present

## 2023-07-16 DIAGNOSIS — M25551 Pain in right hip: Secondary | ICD-10-CM | POA: Diagnosis not present

## 2023-07-16 MED ORDER — PANTOPRAZOLE SODIUM 20 MG PO TBEC
40.0000 mg | DELAYED_RELEASE_TABLET | Freq: Every day | ORAL | 3 refills | Status: DC
  Filled 2023-07-16 – 2023-07-27 (×2): qty 90, 45d supply, fill #0
  Filled 2023-10-19: qty 90, 45d supply, fill #1

## 2023-07-16 MED ORDER — GABAPENTIN 300 MG PO CAPS
300.0000 mg | ORAL_CAPSULE | Freq: Every day | ORAL | 3 refills | Status: AC
  Filled 2023-07-16: qty 90, 90d supply, fill #0

## 2023-07-16 MED ORDER — LISINOPRIL 20 MG PO TABS
20.0000 mg | ORAL_TABLET | Freq: Every day | ORAL | 3 refills | Status: AC
  Filled 2023-10-08: qty 90, 90d supply, fill #0
  Filled 2024-01-10: qty 90, 90d supply, fill #1
  Filled 2024-04-07: qty 90, 90d supply, fill #2

## 2023-07-16 MED ORDER — TIZANIDINE HCL 4 MG PO TABS
4.0000 mg | ORAL_TABLET | Freq: Every evening | ORAL | 1 refills | Status: DC | PRN
  Filled 2023-07-16 – 2023-10-01 (×2): qty 30, 30d supply, fill #0

## 2023-07-19 ENCOUNTER — Other Ambulatory Visit (HOSPITAL_COMMUNITY): Payer: Self-pay

## 2023-07-19 MED ORDER — VITAMIN D3 1.25 MG (50000 UT) PO CAPS
50000.0000 [IU] | ORAL_CAPSULE | ORAL | 3 refills | Status: AC
  Filled 2023-07-19: qty 12, 84d supply, fill #0

## 2023-07-20 ENCOUNTER — Other Ambulatory Visit (HOSPITAL_COMMUNITY): Payer: Self-pay

## 2023-07-26 ENCOUNTER — Other Ambulatory Visit (HOSPITAL_COMMUNITY): Payer: Self-pay

## 2023-07-27 ENCOUNTER — Other Ambulatory Visit (HOSPITAL_COMMUNITY): Payer: Self-pay

## 2023-07-29 ENCOUNTER — Other Ambulatory Visit: Payer: Medicare PPO

## 2023-07-29 ENCOUNTER — Other Ambulatory Visit: Payer: Medicare PPO | Admitting: Obstetrics and Gynecology

## 2023-10-01 ENCOUNTER — Other Ambulatory Visit (HOSPITAL_COMMUNITY): Payer: Self-pay

## 2023-10-08 ENCOUNTER — Other Ambulatory Visit (HOSPITAL_COMMUNITY): Payer: Self-pay

## 2023-10-19 ENCOUNTER — Other Ambulatory Visit (HOSPITAL_COMMUNITY): Payer: Self-pay

## 2023-10-19 MED ORDER — PANTOPRAZOLE SODIUM 20 MG PO TBEC
20.0000 mg | DELAYED_RELEASE_TABLET | Freq: Every day | ORAL | 2 refills | Status: DC
Start: 2023-07-16 — End: 2024-06-15
  Filled 2023-10-19: qty 90, 90d supply, fill #0
  Filled 2024-01-10: qty 90, 90d supply, fill #1
  Filled 2024-04-07: qty 90, 90d supply, fill #2

## 2023-12-15 ENCOUNTER — Other Ambulatory Visit: Payer: Self-pay | Admitting: Family Medicine

## 2023-12-15 ENCOUNTER — Encounter: Payer: Self-pay | Admitting: Family Medicine

## 2023-12-15 DIAGNOSIS — Z1231 Encounter for screening mammogram for malignant neoplasm of breast: Secondary | ICD-10-CM

## 2024-01-10 ENCOUNTER — Other Ambulatory Visit (HOSPITAL_COMMUNITY): Payer: Self-pay

## 2024-01-11 ENCOUNTER — Other Ambulatory Visit (HOSPITAL_COMMUNITY): Payer: Self-pay

## 2024-01-11 DIAGNOSIS — N3281 Overactive bladder: Secondary | ICD-10-CM | POA: Diagnosis not present

## 2024-01-11 DIAGNOSIS — R300 Dysuria: Secondary | ICD-10-CM | POA: Diagnosis not present

## 2024-01-11 DIAGNOSIS — K644 Residual hemorrhoidal skin tags: Secondary | ICD-10-CM | POA: Diagnosis not present

## 2024-01-11 MED ORDER — HYDROCORTISONE (PERIANAL) 2.5 % EX CREA
1.0000 | TOPICAL_CREAM | Freq: Two times a day (BID) | CUTANEOUS | 0 refills | Status: AC
  Filled 2024-01-11 – 2024-01-13 (×2): qty 30, 10d supply, fill #0

## 2024-01-12 ENCOUNTER — Other Ambulatory Visit (HOSPITAL_COMMUNITY): Payer: Self-pay

## 2024-01-13 ENCOUNTER — Other Ambulatory Visit (HOSPITAL_COMMUNITY): Payer: Self-pay

## 2024-01-31 ENCOUNTER — Other Ambulatory Visit (HOSPITAL_COMMUNITY): Payer: Self-pay

## 2024-01-31 DIAGNOSIS — I10 Essential (primary) hypertension: Secondary | ICD-10-CM | POA: Diagnosis not present

## 2024-01-31 MED ORDER — AMLODIPINE BESYLATE 5 MG PO TABS
ORAL_TABLET | ORAL | 1 refills | Status: DC
  Filled 2024-01-31: qty 30, 33d supply, fill #0
  Filled 2024-03-01: qty 30, 30d supply, fill #1

## 2024-02-16 DIAGNOSIS — Z780 Asymptomatic menopausal state: Secondary | ICD-10-CM | POA: Diagnosis not present

## 2024-02-16 DIAGNOSIS — I10 Essential (primary) hypertension: Secondary | ICD-10-CM | POA: Diagnosis not present

## 2024-02-17 ENCOUNTER — Other Ambulatory Visit: Payer: Self-pay | Admitting: Family Medicine

## 2024-02-17 DIAGNOSIS — Z780 Asymptomatic menopausal state: Secondary | ICD-10-CM

## 2024-03-01 ENCOUNTER — Other Ambulatory Visit (HOSPITAL_COMMUNITY): Payer: Self-pay

## 2024-04-07 ENCOUNTER — Other Ambulatory Visit (HOSPITAL_COMMUNITY): Payer: Self-pay

## 2024-05-04 ENCOUNTER — Other Ambulatory Visit (HOSPITAL_COMMUNITY): Payer: Self-pay

## 2024-05-04 DIAGNOSIS — M79675 Pain in left toe(s): Secondary | ICD-10-CM | POA: Diagnosis not present

## 2024-05-04 DIAGNOSIS — B351 Tinea unguium: Secondary | ICD-10-CM | POA: Diagnosis not present

## 2024-05-04 MED ORDER — CEPHALEXIN 500 MG PO CAPS
1000.0000 mg | ORAL_CAPSULE | Freq: Two times a day (BID) | ORAL | 0 refills | Status: DC
  Filled 2024-05-04: qty 40, 10d supply, fill #0

## 2024-05-09 ENCOUNTER — Other Ambulatory Visit: Payer: Self-pay

## 2024-05-15 ENCOUNTER — Other Ambulatory Visit: Payer: Self-pay

## 2024-05-15 ENCOUNTER — Other Ambulatory Visit (HOSPITAL_COMMUNITY): Payer: Self-pay

## 2024-05-15 ENCOUNTER — Other Ambulatory Visit (HOSPITAL_BASED_OUTPATIENT_CLINIC_OR_DEPARTMENT_OTHER): Payer: Self-pay

## 2024-05-15 MED ORDER — AMLODIPINE BESYLATE 5 MG PO TABS
2.5000 mg | ORAL_TABLET | Freq: Every day | ORAL | 1 refills | Status: AC
  Filled 2024-05-15: qty 30, 60d supply, fill #0

## 2024-05-30 ENCOUNTER — Other Ambulatory Visit (HOSPITAL_COMMUNITY): Payer: Self-pay

## 2024-05-30 MED ORDER — CEPHALEXIN 500 MG PO CAPS
1000.0000 mg | ORAL_CAPSULE | Freq: Two times a day (BID) | ORAL | 0 refills | Status: AC
  Filled 2024-05-30: qty 12, 3d supply, fill #0

## 2024-06-12 ENCOUNTER — Ambulatory Visit: Payer: Commercial Managed Care - PPO

## 2024-06-15 ENCOUNTER — Other Ambulatory Visit (HOSPITAL_COMMUNITY): Payer: Self-pay

## 2024-06-15 DIAGNOSIS — Z1211 Encounter for screening for malignant neoplasm of colon: Secondary | ICD-10-CM | POA: Diagnosis not present

## 2024-06-15 DIAGNOSIS — M25551 Pain in right hip: Secondary | ICD-10-CM | POA: Diagnosis not present

## 2024-06-15 DIAGNOSIS — Z0000 Encounter for general adult medical examination without abnormal findings: Secondary | ICD-10-CM | POA: Diagnosis not present

## 2024-06-15 DIAGNOSIS — I10 Essential (primary) hypertension: Secondary | ICD-10-CM | POA: Diagnosis not present

## 2024-06-15 DIAGNOSIS — E785 Hyperlipidemia, unspecified: Secondary | ICD-10-CM | POA: Diagnosis not present

## 2024-06-15 DIAGNOSIS — E559 Vitamin D deficiency, unspecified: Secondary | ICD-10-CM | POA: Diagnosis not present

## 2024-06-15 DIAGNOSIS — K219 Gastro-esophageal reflux disease without esophagitis: Secondary | ICD-10-CM | POA: Diagnosis not present

## 2024-06-15 MED ORDER — GABAPENTIN 300 MG PO CAPS
300.0000 mg | ORAL_CAPSULE | Freq: Every day | ORAL | 3 refills | Status: AC
  Filled 2024-10-09: qty 90, 90d supply, fill #0

## 2024-06-15 MED ORDER — AMLODIPINE BESYLATE 5 MG PO TABS
2.5000 mg | ORAL_TABLET | Freq: Every day | ORAL | 3 refills | Status: AC
  Filled 2024-07-10: qty 45, 90d supply, fill #0
  Filled 2024-10-09: qty 45, 90d supply, fill #1

## 2024-06-15 MED ORDER — LISINOPRIL 20 MG PO TABS
20.0000 mg | ORAL_TABLET | Freq: Every day | ORAL | 3 refills | Status: AC
  Filled 2024-07-10 (×2): qty 90, 90d supply, fill #0
  Filled 2024-10-09: qty 90, 90d supply, fill #1

## 2024-06-15 MED ORDER — PANTOPRAZOLE SODIUM 20 MG PO TBEC
20.0000 mg | DELAYED_RELEASE_TABLET | Freq: Every day | ORAL | 3 refills | Status: AC
  Filled 2024-07-10: qty 90, 90d supply, fill #0
  Filled 2024-10-09: qty 90, 90d supply, fill #1

## 2024-06-16 ENCOUNTER — Other Ambulatory Visit (HOSPITAL_COMMUNITY): Payer: Self-pay

## 2024-06-22 ENCOUNTER — Ambulatory Visit
Admission: RE | Admit: 2024-06-22 | Discharge: 2024-06-22 | Disposition: A | Discharge status: Home / Self Care | Source: Ambulatory Visit | Attending: Family Medicine | Admitting: Family Medicine

## 2024-06-22 DIAGNOSIS — Z1231 Encounter for screening mammogram for malignant neoplasm of breast: Secondary | ICD-10-CM | POA: Diagnosis not present

## 2024-07-10 ENCOUNTER — Other Ambulatory Visit (HOSPITAL_COMMUNITY): Payer: Self-pay

## 2024-07-10 ENCOUNTER — Other Ambulatory Visit: Payer: Self-pay

## 2024-08-15 DIAGNOSIS — L738 Other specified follicular disorders: Secondary | ICD-10-CM | POA: Diagnosis not present

## 2024-08-15 DIAGNOSIS — R208 Other disturbances of skin sensation: Secondary | ICD-10-CM | POA: Diagnosis not present

## 2024-08-15 DIAGNOSIS — L821 Other seborrheic keratosis: Secondary | ICD-10-CM | POA: Diagnosis not present

## 2024-08-15 DIAGNOSIS — L820 Inflamed seborrheic keratosis: Secondary | ICD-10-CM | POA: Diagnosis not present

## 2024-08-15 DIAGNOSIS — L720 Epidermal cyst: Secondary | ICD-10-CM | POA: Diagnosis not present

## 2024-08-15 DIAGNOSIS — L538 Other specified erythematous conditions: Secondary | ICD-10-CM | POA: Diagnosis not present

## 2024-08-15 DIAGNOSIS — L2989 Other pruritus: Secondary | ICD-10-CM | POA: Diagnosis not present

## 2024-08-22 ENCOUNTER — Other Ambulatory Visit (HOSPITAL_BASED_OUTPATIENT_CLINIC_OR_DEPARTMENT_OTHER): Payer: Self-pay | Admitting: Physician Assistant

## 2024-08-22 ENCOUNTER — Ambulatory Visit (HOSPITAL_BASED_OUTPATIENT_CLINIC_OR_DEPARTMENT_OTHER)

## 2024-08-22 DIAGNOSIS — R1031 Right lower quadrant pain: Secondary | ICD-10-CM

## 2024-08-26 ENCOUNTER — Emergency Department (HOSPITAL_BASED_OUTPATIENT_CLINIC_OR_DEPARTMENT_OTHER)

## 2024-08-26 ENCOUNTER — Other Ambulatory Visit: Payer: Self-pay

## 2024-08-26 ENCOUNTER — Emergency Department (HOSPITAL_BASED_OUTPATIENT_CLINIC_OR_DEPARTMENT_OTHER)
Admission: EM | Admit: 2024-08-26 | Discharge: 2024-08-26 | Disposition: A | Discharge status: Home / Self Care | Attending: Emergency Medicine | Admitting: Emergency Medicine

## 2024-08-26 ENCOUNTER — Encounter (HOSPITAL_BASED_OUTPATIENT_CLINIC_OR_DEPARTMENT_OTHER): Payer: Self-pay

## 2024-08-26 DIAGNOSIS — R1031 Right lower quadrant pain: Secondary | ICD-10-CM

## 2024-08-26 DIAGNOSIS — N202 Calculus of kidney with calculus of ureter: Secondary | ICD-10-CM | POA: Diagnosis not present

## 2024-08-26 DIAGNOSIS — N200 Calculus of kidney: Secondary | ICD-10-CM | POA: Diagnosis not present

## 2024-08-26 DIAGNOSIS — R319 Hematuria, unspecified: Secondary | ICD-10-CM | POA: Diagnosis not present

## 2024-08-26 DIAGNOSIS — N201 Calculus of ureter: Secondary | ICD-10-CM | POA: Diagnosis not present

## 2024-08-26 DIAGNOSIS — K429 Umbilical hernia without obstruction or gangrene: Secondary | ICD-10-CM | POA: Diagnosis not present

## 2024-08-26 LAB — CBC WITH DIFFERENTIAL/PLATELET
Abs Immature Granulocytes: 0.02 K/uL (ref 0.00–0.07)
Basophils Absolute: 0 K/uL (ref 0.0–0.1)
Basophils Relative: 0 %
Eosinophils Absolute: 0.1 K/uL (ref 0.0–0.5)
Eosinophils Relative: 1 %
HCT: 43.9 % (ref 36.0–46.0)
Hemoglobin: 14.2 g/dL (ref 12.0–15.0)
Immature Granulocytes: 0 %
Lymphocytes Relative: 32 %
Lymphs Abs: 2.2 K/uL (ref 0.7–4.0)
MCH: 28.5 pg (ref 26.0–34.0)
MCHC: 32.3 g/dL (ref 30.0–36.0)
MCV: 88 fL (ref 80.0–100.0)
Monocytes Absolute: 0.4 K/uL (ref 0.1–1.0)
Monocytes Relative: 6 %
Neutro Abs: 4 K/uL (ref 1.7–7.7)
Neutrophils Relative %: 61 %
Platelets: 194 K/uL (ref 150–400)
RBC: 4.99 MIL/uL (ref 3.87–5.11)
RDW: 13.3 % (ref 11.5–15.5)
WBC: 6.7 K/uL (ref 4.0–10.5)
nRBC: 0 % (ref 0.0–0.2)

## 2024-08-26 LAB — COMPREHENSIVE METABOLIC PANEL WITH GFR
ALT: 9 U/L (ref 0–44)
AST: 21 U/L (ref 15–41)
Albumin: 4.5 g/dL (ref 3.5–5.0)
Alkaline Phosphatase: 82 U/L (ref 38–126)
Anion gap: 13 (ref 5–15)
BUN: 13 mg/dL (ref 8–23)
CO2: 26 mmol/L (ref 22–32)
Calcium: 10.1 mg/dL (ref 8.9–10.3)
Chloride: 105 mmol/L (ref 98–111)
Creatinine, Ser: 0.88 mg/dL (ref 0.44–1.00)
GFR, Estimated: >60 mL/min (ref >60)
Glucose, Bld: 107 mg/dL — ABNORMAL HIGH (ref 70–99)
Potassium: 3.8 mmol/L (ref 3.5–5.1)
Sodium: 144 mmol/L (ref 135–145)
Total Bilirubin: 0.8 mg/dL (ref 0.0–1.2)
Total Protein: 8 g/dL (ref 6.5–8.1)

## 2024-08-26 LAB — URINALYSIS, ROUTINE W REFLEX MICROSCOPIC
Bilirubin Urine: NEGATIVE
Glucose, UA: NEGATIVE mg/dL
Ketones, ur: NEGATIVE mg/dL
Leukocytes,Ua: NEGATIVE
Nitrite: NEGATIVE
Protein, ur: NEGATIVE mg/dL
Specific Gravity, Urine: 1.018 (ref 1.005–1.030)
pH: 6 (ref 5.0–8.0)

## 2024-08-26 LAB — LIPASE, BLOOD: Lipase: 53 U/L — ABNORMAL HIGH (ref 11–51)

## 2024-08-26 MED ORDER — ONDANSETRON 4 MG PO TBDP
4.0000 mg | ORAL_TABLET | Freq: Three times a day (TID) | ORAL | 0 refills | Status: DC | PRN
  Filled 2024-08-26: qty 20, 7d supply, fill #0

## 2024-08-26 MED ORDER — OXYCODONE-ACETAMINOPHEN 5-325 MG PO TABS
1.0000 | ORAL_TABLET | Freq: Four times a day (QID) | ORAL | 0 refills | Status: DC | PRN
  Filled 2024-08-26: qty 10, 3d supply, fill #0

## 2024-08-26 MED ORDER — IOHEXOL 300 MG/ML  SOLN
100.0000 mL | Freq: Once | INTRAMUSCULAR | Status: AC | PRN
  Administered 2024-08-26: 100 mL via INTRAVENOUS

## 2024-08-26 NOTE — ED Notes (Signed)
 Pt provided education on how to strain urine, given strainer and urine cup for home use. Pt verbalized understanding with no further questions. Pt d/c instructions, medications, and follow-up care reviewed with pt. Pt verbalized understanding and had no further questions at time of d/c. Pt CA&Ox4, ambulatory, and in NAD at time of d/c

## 2024-08-26 NOTE — Discharge Instructions (Addendum)
 You had a 3mm kidney stone located in your ureter. Strain all urine with a strainer that was provided to you.  Make sure you drink plenty of water to maintain hydration.  Please use Tylenol  or ibuprofen for pain.  You may use 600 mg ibuprofen every 6 hours or 1000 mg of Tylenol  every 6 hours.  You may choose to alternate between the 2.  This would be most effective.  Not to exceed 4 g of Tylenol  within 24 hours.  Not to exceed 3200 mg ibuprofen 24 hours.     I have prescribed or offered you other medicine for breakthrough pain. Notably there is a small amount of Tylenol --specifically 325 mg--in each dose of Percocet.  If you do take a dose of Percocet please take a 500 mg instead of the 1000 mg dose of Tylenol .     Follow up with [facility] I have given you the information for their office.  Generally kidney stones pass on their own given time in the focus of treatment is minimizing pain

## 2024-08-26 NOTE — ED Triage Notes (Signed)
 Complains of right lower quad pain x 1 week.  Went to PCP and is suppose to have CT scan but reports pain is getting worse.  Denies urinary symptoms but does report watery diarrhea.  Pain is worse with lying down.

## 2024-08-26 NOTE — ED Provider Notes (Signed)
 [place] EM[facility] DEPARTMENT AT [facility] Provider Note   CSN: [id number] Arrival date & time: 08/26/24  9077     Patient presents with: Abdominal Pain   Deborah Mercado is a 73 y.o. female.    Abdominal Pain  Patient with history of breast cancer in remission, reflux, allergies  Patient presented to the emergency room today with history of 1 week of abdominal pain and right lower abdomen seems that it has been semipersistent.  Has been pooping normally sometimes a little bit watery but no blood in her stool.  No urinary frequency urgency dysuria or hematuria.  No chest pain difficulty breathing.  No recent antibiotic use no fevers no history of C. difficile.  No other associate symptoms        Prior to Admission medications   Medication Sig Start Date End Date Taking? Authorizing Provider  ondansetron  (ZOFRAN -ODT) 4 MG disintegrating tablet Take 1 tablet (4 mg total) by mouth every 8 (eight) hours as needed for nausea or vomiting. 08/26/24  Yes Colie Josten, Hamp RAMAN, PA  oxyCODONE -acetaminophen  (PERCOCET/ROXICET) 5-325 MG tablet Take 1 tablet by mouth every 6 (six) hours as needed for severe pain (pain score 7-10). 08/26/24  Yes Maisen Klingler S, PA  amLODipine  (NORVASC ) 5 MG tablet Take 0.5 tablets (2.5 mg total) by mouth daily. 05/15/24     amLODipine  (NORVASC ) 5 MG tablet Take 1/2 tablet (2.5 mg total) by mouth daily. 06/15/24     cephALEXin  (KEFLEX ) 500 MG capsule Take 2 capsules (1,000 mg total) by mouth 2 (two) times daily. 05/30/24     Cholecalciferol  (VITAMIN D3) 1.25 MG (50000 UT) CAPS Take 1 capsule (50,000 Units total) by mouth once a week. 07/19/23     gabapentin  (NEURONTIN ) 300 MG capsule Take 1 capsule (300 mg total) by mouth daily. 09/14/19   Magrinat, Sandria BROCKS, MD  gabapentin  (NEURONTIN ) 300 MG capsule Take 1-2 capsules (300-600 mg total) by mouth daily. 06/24/23     gabapentin  (NEURONTIN ) 300 MG capsule Take 1 capsule (300 mg total) by mouth daily. 07/16/23      gabapentin  (NEURONTIN ) 300 MG capsule Take 1 capsule (300 mg total) by mouth daily. 06/15/24     lisinopril  (ZESTRIL ) 20 MG tablet Take 1 tablet (20 mg total) by mouth daily. 07/16/23     lisinopril  (ZESTRIL ) 20 MG tablet Take 1 tablet (20 mg total) by mouth daily. 06/15/24     meloxicam  (MOBIC ) 15 MG tablet Take 1 tablet (15 mg total) by mouth daily as needed. 04/27/23     meloxicam  (MOBIC ) 15 MG tablet Take 1 tablet (15 mg total) by mouth daily if needed 06/24/23     pantoprazole  (PROTONIX ) 20 MG tablet Take 1 tablet (20 mg total) by mouth daily. 06/15/24     tiZANidine  (ZANAFLEX ) 4 MG tablet Take 1 tablet (4 mg total) by mouth at bedtime as needed. 06/24/23     tiZANidine  (ZANAFLEX ) 4 MG tablet Take 1 tablet (4 mg total) by mouth at bedtime as needed. 07/16/23       Allergies: Hydrocortisone , Influenza vaccines, and Monistat [miconazole]    Review of Systems  Gastrointestinal:  Positive for abdominal pain.    Updated Vital Signs BP (!) 141/67   Pulse 63   Temp 98.6 F (37 C) (Oral)   Resp 16   Ht 5' 4 (1.626 m)   Wt 81.2 kg   LMP 11/30/2004 (Approximate)   SpO2 100%   BMI 30.73 kg/m   Physical Exam Vitals and nursing  note reviewed.  Constitutional:      General: She is not in acute distress.    Appearance: She is obese.  HENT:     Head: Normocephalic and atraumatic.     Nose: Nose normal.  Eyes:     General: No scleral icterus. Cardiovascular:     Rate and Rhythm: Normal rate and regular rhythm.     Pulses: Normal pulses.     Heart sounds: Normal heart sounds.  Pulmonary:     Effort: Pulmonary effort is normal. No respiratory distress.     Breath sounds: No wheezing.  Abdominal:     Palpations: Abdomen is soft.     Tenderness: There is no abdominal tenderness.  Musculoskeletal:     Cervical back: Normal range of motion.     Right lower leg: No edema.     Left lower leg: No edema.  Skin:    General: Skin is warm and dry.     Capillary Refill: Capillary refill  takes less than 2 seconds.  Neurological:     Mental Status: She is alert. Mental status is at baseline.  Psychiatric:        Mood and Affect: Mood normal.        Behavior: Behavior normal.     (all labs ordered are listed, but only abnormal results are displayed) Labs Reviewed  LIPASE, BLOOD - Abnormal; Notable for the following components:      Result Value   Lipase 53 (*)    All other components within normal limits  COMPREHENSIVE METABOLIC PANEL WITH GFR - Abnormal; Notable for the following components:   Glucose, Bld 107 (*)    All other components within normal limits  URINALYSIS, ROUTINE W REFLEX MICROSCOPIC - Abnormal; Notable for the following components:   Hgb urine dipstick TRACE (*)    Bacteria, UA RARE (*)    All other components within normal limits  CBC WITH DIFFERENTIAL/PLATELET    EKG: None  Radiology: CT ABDOMEN PELVIS W CONTRAST Result Date: 08/26/2024 EXAM: CT ABDOMEN AND PELVIS WITH CONTRAST 08/26/2024 10:42:29 AM TECHNIQUE: CT of the abdomen and pelvis was performed with the administration of 100 mL of iohexol (OMNIPAQUE) 300 MG/ML solution. Multiplanar reformatted images are provided for review. Automated exposure control, iterative reconstruction, and/or weight-based adjustment of the mA/kV was utilized to reduce the radiation dose to as low as reasonably achievable. COMPARISON: None available. CLINICAL HISTORY: RLQ abdominal pain; RLQ pain no significant TTP. no fever. FINDINGS: LOWER CHEST: No acute abnormality. LIVER: The liver is unremarkable. GALLBLADDER AND BILE DUCTS: Gallbladder is unremarkable. No biliary ductal dilatation. SPLEEN: No acute abnormality. PANCREAS: No acute abnormality. ADRENAL GLANDS: No acute abnormality. KIDNEYS, URETERS AND BLADDER: Duplex right renal collecting system. Mild dilatation of the lower pole collecting system. Stone within the distal right ureter measuring 3 mm, image 75/2. No stones in the kidneys. No hydronephrosis. No  perinephric or periureteral stranding. Urinary bladder is unremarkable. GI AND BOWEL: Stomach demonstrates no acute abnormality. The appendix is visualized and is within normal limits. No bowel dilatation or signs of inflammation. There is no bowel obstruction. PERITONEUM AND RETROPERITONEUM: No ascites. No free air. VASCULATURE: Aorta is normal in caliber. Aortic atherosclerosis. LYMPH NODES: No lymphadenopathy. REPRODUCTIVE ORGANS: Uterus and adnexal structures are unremarkable. BONES AND SOFT TISSUES: No acute osseous abnormality. Small fat-containing umbilical hernia noted. No focal soft tissue abnormality. IMPRESSION: 1. Distal right ureteral stone measuring 3 mm, with mild dilatation of the lower pole collecting system, compatible with mild  right obstructive uropathy. 2. Small fat-containing umbilical hernia. Electronically signed by: Waddell Calk MD 08/26/2024 11:24 AM EDT RP Workstation: [id number]     Procedures   Medications Ordered in the ED  iohexol (OMNIPAQUE) 300 MG/ML solution 100 mL (100 mLs Intravenous Contrast Given 08/26/24 1035)                                    Medical Decision Making Amount and/or Complexity of Data Reviewed Labs: ordered. Radiology: ordered.  Risk Prescription drug management.   This patient presents to the ED for concern of abd pain, this involves a number of treatment options, and is a complaint that carries with it a moderate  risk of complications and morbidity. A differential diagnosis was considered for the patient's symptoms which is discussed below:   The causes of generalized abdominal pain include but are not limited to AAA, mesenteric ischemia, appendicitis, diverticulitis, DKA, gastritis, gastroenteritis, AMI, nephrolithiasis, pancreatitis, peritonitis, adrenal insufficiency,lead poisoning, iron toxicity, intestinal ischemia, constipation, UTI,SBO/LBO, splenic rupture, biliary disease, IBD, IBS, PUD, or hepatitis.    Co  morbidities: Discussed in HPI   Brief History:  Patient with history of breast cancer in remission, reflux, allergies  Patient presented to the emergency room today with history of 1 week of abdominal pain and right lower abdomen seems that it has been semipersistent.  Has been pooping normally sometimes a little bit watery but no blood in her stool.  No urinary frequency urgency dysuria or hematuria.  No chest pain difficulty breathing.  No recent antibiotic use no fevers no history of C. difficile.  No other associate symptoms       EMR reviewed including pt PMHx, past surgical history and past visits to ER.   See HPI for more details   Lab Tests:   I personally reviewed all laboratory work and imaging. Metabolic panel without any acute abnormality specifically kidney function within normal limits and no significant electrolyte abnormalities. CBC without leukocytosis or significant anemia. Lipase essentially normal, urinalysis with trace hematuria  Imaging Studies:  Abnormal findings. I personally reviewed all imaging studies. Imaging notable for  IMPRESSION:  1. Distal right ureteral stone measuring 3 mm, with mild dilatation of the  lower pole collecting system, compatible with mild right obstructive uropathy.  2. Small fat-containing umbilical hernia.    Cardiac Monitoring:  The patient was maintained on a cardiac monitor.  I personally viewed and interpreted the cardiac monitored which showed an underlying rhythm of: NSR NA   Medicines ordered:    Critical Interventions:     Consults/Attending Physician      Reevaluation:  After the interventions noted above I re-evaluated patient and found that they have :stayed the same   Social Determinants of Health:      Problem List / ED Course:  Patient with 1 week of right lower quadrant abdominal pain noted to have 3 mm stone distal right ureter she is only having mild pain.  Well-appearing on exam.   Tolerating p.o. declines any pain medicine I will place order for Percocet and Zofran  for her if needed for breakthrough pain and follow-up with urology strain urine.  Return precautions discussed   Dispostion:  After consideration of the diagnostic results and the patients response to treatment, I feel that the patent would benefit from outpatient follow-up.    Final diagnoses:  Right lower quadrant abdominal pain  Hematuria, unspecified type  Nephrolithiasis  ED Discharge Orders          Ordered    oxyCODONE -acetaminophen  (PERCOCET/ROXICET) 5-325 MG tablet  Every 6 hours PRN        08/26/24 1244    ondansetron  (ZOFRAN -ODT) 4 MG disintegrating tablet  Every 8 hours PRN        08/26/24 1244               Neldon Hamp RAMAN, [place] 08/26/24 1312    Zackowski, Scott, MD 08/29/24 2212

## 2024-08-28 ENCOUNTER — Other Ambulatory Visit (HOSPITAL_COMMUNITY): Payer: Self-pay

## 2024-09-06 ENCOUNTER — Other Ambulatory Visit (HOSPITAL_COMMUNITY): Payer: Self-pay

## 2024-10-09 ENCOUNTER — Other Ambulatory Visit: Payer: Self-pay

## 2024-10-09 ENCOUNTER — Other Ambulatory Visit (HOSPITAL_COMMUNITY): Payer: Self-pay

## 2024-10-23 ENCOUNTER — Other Ambulatory Visit (HOSPITAL_COMMUNITY): Payer: Self-pay

## 2024-10-23 MED ORDER — HYDROCORTISONE (PERIANAL) 2.5 % EX CREA
1.0000 | TOPICAL_CREAM | Freq: Two times a day (BID) | CUTANEOUS | 3 refills | Status: DC
  Filled 2024-10-23: qty 30, 30d supply, fill #0

## 2024-11-07 NOTE — Progress Notes (Unsigned)
 GYNECOLOGY  VISIT   HPI: 73 y.o.   Divorced  Caucasian female   G1P1001 with Patient's last menstrual period was 11/30/2004 (approximate).   here for: hemorrhoids      GYNECOLOGIC HISTORY: Patient's last menstrual period was 11/30/2004 (approximate). Contraception:  PMP Menopausal hormone therapy:  n/a Last 2 paps:  04/22/23 neg History of abnormal Pap or positive HPV:  no Mammogram:  06/22/24 Breast Density Cat B, BIRADS Cat 1 neg         OB History     Gravida  1   Para  1   Term  1   Preterm  0   AB  0   Living  1      SAB  0   IAB  0   Ectopic  0   Multiple  0   Live Births  1              Patient Active Problem List   Diagnosis Date Noted   Hematuria 08/26/2024   Hypertension 09/13/2018   Malignant neoplasm of upper-outer quadrant of right breast in female, estrogen receptor positive (HCC) 04/04/2015   GERD (gastroesophageal reflux disease) 02/19/2014   Laboratory examination ordered as part of a routine general medical examination 02/19/2014   Vitamin D  deficiency 02/19/2014   Urge incontinence 02/19/2014    Past Medical History:  Diagnosis Date   Allergy    Breast cancer (HCC) 04/01/15   Right breast Invasive ductl carcinoma   GERD (gastroesophageal reflux disease)    Personal history of radiation therapy 2016   SUI (stress urinary incontinence, female)    Vitamin D  deficiency     Past Surgical History:  Procedure Laterality Date   BREAST BIOPSY Right 04/01/2015   malignant   BREAST LUMPECTOMY Right 04/19/2015   BREAST LUMPECTOMY WITH RADIOACTIVE SEED AND SENTINEL LYMPH NODE BIOPSY Right 04/19/2015   Procedure: BREAST LUMPECTOMY WITH RADIOACTIVE SEED AND SENTINEL LYMPH NODE MAPPING;  Surgeon: Deward Null III, MD;  Location: [facility];  Service: General;  Laterality: Right;    Current Outpatient Medications  Medication Sig Dispense Refill   amLODipine  (NORVASC ) 5 MG tablet Take 0.5 tablets (2.5 mg total) by mouth daily.  30 tablet 1   amLODipine  (NORVASC ) 5 MG tablet Take 1/2 tablet (2.5 mg total) by mouth daily. 45 tablet 3   cephALEXin  (KEFLEX ) 500 MG capsule Take 2 capsules (1,000 mg total) by mouth 2 (two) times daily. 12 capsule 0   Cholecalciferol  (VITAMIN D3) 1.25 MG (50000 UT) CAPS Take 1 capsule (50,000 Units total) by mouth once a week. 12 capsule 3   gabapentin  (NEURONTIN ) 300 MG capsule Take 1 capsule (300 mg total) by mouth daily. 90 capsule 4   gabapentin  (NEURONTIN ) 300 MG capsule Take 1-2 capsules (300-600 mg total) by mouth daily. 180 capsule 3   gabapentin  (NEURONTIN ) 300 MG capsule Take 1 capsule (300 mg total) by mouth daily. 90 capsule 3   gabapentin  (NEURONTIN ) 300 MG capsule Take 1 capsule (300 mg total) by mouth daily. 90 capsule 3   hydrocortisone  (ANUSOL -HC) 2.5 % rectal cream Apply 1 Application topically 2 (two) times daily. 30 g 3   lisinopril  (ZESTRIL ) 20 MG tablet Take 1 tablet (20 mg total) by mouth daily. 90 tablet 3   lisinopril  (ZESTRIL ) 20 MG tablet Take 1 tablet (20 mg total) by mouth daily. 90 tablet 3   meloxicam  (MOBIC ) 15 MG tablet Take 1 tablet (15 mg total) by mouth daily as  needed. 30 tablet 0   meloxicam  (MOBIC ) 15 MG tablet Take 1 tablet (15 mg total) by mouth daily if needed 30 tablet 1   ondansetron  (ZOFRAN -ODT) 4 MG disintegrating tablet Take 1 tablet (4 mg total) by mouth every 8 (eight) hours as needed for nausea or vomiting. 20 tablet 0   oxyCODONE -acetaminophen  (PERCOCET/ROXICET) 5-325 MG tablet Take 1 tablet by mouth every 6 (six) hours as needed for severe pain (pain score 7-10). 10 tablet 0   pantoprazole  (PROTONIX ) 20 MG tablet Take 1 tablet (20 mg total) by mouth daily. 90 tablet 3   tiZANidine  (ZANAFLEX ) 4 MG tablet Take 1 tablet (4 mg total) by mouth at bedtime as needed. 30 tablet 1   tiZANidine  (ZANAFLEX ) 4 MG tablet Take 1 tablet (4 mg total) by mouth at bedtime as needed. 30 tablet 1   No current facility-administered medications for this visit.      ALLERGIES: Hydrocortisone , Influenza vaccines, and Monistat [miconazole]  Family History  Problem Relation Age of Onset   Heart disease Mother    Rheum arthritis Mother    Hypertension Mother    Rheum arthritis Maternal Grandmother    Heart attack Maternal Grandmother    Heart disease Maternal Grandmother    Breast cancer Cousin     Social History   Socioeconomic History   Marital status: Divorced    Spouse name: Not on file   Number of children: 1   Years of education: Not on file   Highest education level: Not on file  Occupational History   Occupation: Clinical Cytogeneticist    Employer: [employer]  Tobacco Use   Smoking status: Never   Smokeless tobacco: Never  Vaping Use   Vaping status: Never Used  Substance and Sexual Activity   Alcohol use: No   Drug use: No   Sexual activity: Not Currently    Partners: Male    Birth control/protection: Post-menopausal, Abstinence    Comment: <5, > 64 y/o, no STD  Other Topics Concern   Not on file  Social History Narrative   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   Social Drivers of Corporate Investment Banker Strain: Not on file  Food Insecurity: Not on file  Transportation Needs: Not on file  Physical Activity: Not on file  Stress: Not on file  Social Connections: Not on file  Intimate Partner Violence: Not on file    Review of Systems  PHYSICAL EXAMINATION:   LMP 11/30/2004 (Approximate)     General appearance: alert, cooperative and appears stated age Head: Normocephalic, without obvious abnormality, atraumatic Neck: no adenopathy, supple, symmetrical, trachea midline and thyroid  normal to inspection and palpation Lungs: clear to auscultation bilaterally Breasts: normal appearance, no masses or tenderness, No nipple retraction or dimpling, No nipple discharge or bleeding, No axillary or supraclavicular adenopathy Heart: regular rate and rhythm Abdomen: soft, non-tender, no masses,  no  organomegaly Extremities: extremities normal, atraumatic, no cyanosis or edema Skin: Skin color, texture, turgor normal. No rashes or lesions Lymph nodes: Cervical, supraclavicular, and axillary nodes normal. No abnormal inguinal nodes palpated Neurologic: Grossly normal  Pelvic: External genitalia:  no lesions              Urethra:  normal appearing urethra with no masses, tenderness or lesions              Bartholins and Skenes: normal  Vagina: normal appearing vagina with normal color and discharge, no lesions              Cervix: no lesions                Bimanual Exam:  Uterus:  normal size, contour, position, consistency, mobility, non-tender              Adnexa: no mass, fullness, tenderness              Rectal exam: {yes no:314532}.  Confirms.              Anus:  normal sphincter tone, no lesions  Chaperone was present for exam:  {BSCHAPERONE:31226::Emily F, CMA}  ASSESSMENT:    PLAN:    {LABS (Optional):23779}  ***  total time was spent for this patient encounter, including preparation, face-to-face counseling with the patient, coordination of care, and documentation of the encounter.

## 2024-11-08 ENCOUNTER — Other Ambulatory Visit: Payer: Self-pay | Admitting: Obstetrics and Gynecology

## 2024-11-08 ENCOUNTER — Ambulatory Visit: Admitting: Obstetrics and Gynecology

## 2024-11-08 ENCOUNTER — Other Ambulatory Visit (HOSPITAL_COMMUNITY): Payer: Self-pay

## 2024-11-08 ENCOUNTER — Encounter: Payer: Self-pay | Admitting: Obstetrics and Gynecology

## 2024-11-08 ENCOUNTER — Ambulatory Visit: Admitting: Nurse Practitioner

## 2024-11-08 VITALS — BP 124/80 | HR 72

## 2024-11-08 DIAGNOSIS — L723 Sebaceous cyst: Secondary | ICD-10-CM

## 2024-11-08 DIAGNOSIS — K644 Residual hemorrhoidal skin tags: Secondary | ICD-10-CM

## 2024-11-08 MED ORDER — HYDROCORTISONE ACETATE 25 MG RE SUPP
25.0000 mg | Freq: Two times a day (BID) | RECTAL | 0 refills | Status: AC
  Filled 2024-11-08 – 2024-11-14 (×2): qty 15, 8d supply, fill #0

## 2024-11-08 MED ORDER — LIDOCAINE 5 % EX OINT
1.0000 | TOPICAL_OINTMENT | Freq: Three times a day (TID) | CUTANEOUS | 0 refills | Status: AC
  Filled 2024-11-08: qty 50, 17d supply, fill #0

## 2024-11-08 NOTE — Patient Instructions (Signed)
 Hemorrhoids Hemorrhoids are swollen veins in and around the rectum or the opening of the butt (anus). There are two types of hemorrhoids: Internal. These occur in the veins just inside the rectum. They may poke through to the outside and become irritated and painful. External. These occur in the veins outside the anus. They can be felt as a painful swelling or hard lump near the anus. Most hemorrhoids do not cause severe problems. Often, they can be treated at home with diet and lifestyle changes. If home treatments do not help, you may need a procedure to shrink or remove the hemorrhoids. What are the causes? Hemorrhoids are caused by pressure near the anus. This pressure may be caused by: Constipation or diarrhea. Straining to poop. Pregnancy. Obesity. Sitting or riding a bike for a long time. Heavy lifting or other things that cause you to strain. Anal sex. What are the signs or symptoms? Symptoms of this condition include: Pain. Anal itching or irritation. Bleeding from the rectum. Leakage of poop (stool). Swelling of the anus. One or more lumps around the anus. How is this diagnosed? Hemorrhoids can often be diagnosed through a visual exam. Other exams or tests may also be done, such as: A digital rectal exam. This is when your health care provider feels inside your rectum with a gloved finger. Anoscope. This is an exam of the anus using a small tube. A blood test, if you have lost a lot of blood. A sigmoidoscopy or colonoscopy. These are tests to look inside the colon using a tube with a camera on the end. How is this treated? In most cases, hemorrhoids can be treated at home with diet and lifestyle changes. If these changes do not help, you may need to have a procedure done. These procedures can make the hemorrhoids smaller or fully remove them. Common procedures include: Rubber band ligation. Rubber bands are placed at the base of the hemorrhoids to cut off their blood  supply. Sclerotherapy. Medicine is put into the hemorrhoids to shrink them. Infrared coagulation. A type of light energy is used to get rid of the hemorrhoids. Hemorrhoidectomy surgery. The hemorrhoids are removed during surgery. Then, the veins that supply them are tied off. Stapled hemorrhoidopexy surgery. The base of the hemorrhoid is stapled to the wall of the rectum. Follow these instructions at home: Medicines Take over-the-counter and prescription medicines only as told by your provider. Use medicated creams or medicines that are put in the rectum (suppositories) as told by your provider. Eating and drinking  Eat foods that are high in fiber, such as beans, whole grains, and fresh fruits and vegetables. Ask your provider about taking products that have fiber added to them (fiber supplements). Reduce the amount of fat in your diet. You can do this by eating low-fat dairy products, eating less red meat, and avoiding processed foods. Drink enough fluid to keep your pee (urine) pale yellow. Managing pain and swelling  Take warm sitz baths for 20 minutes, 3-4 times a day. This can help ease pain and discomfort. You may do this in a bathtub or you can use a portable sitz bath that fits over the toilet. If told, put ice on the affected area. It may help to use ice packs between sitz baths. Put ice in a plastic bag. Place a towel between your skin and the bag. Leave the ice on for 20 minutes, 2-3 times a day. If your skin turns bright red, remove the ice right away to prevent  skin damage. The risk of damage is higher if you cannot feel pain, heat, or cold. General instructions Exercise. Ask your provider how much and what kind of exercise is best for you. In general, you should do moderate exercise for at least 30 minutes on most days of the week (150 minutes each week). You may want to try walking, biking, or yoga. Go to the bathroom when you have the urge to poop. Do not wait. Avoid  straining to poop. Keep the anus dry and clean. Use wet toilet paper or moist towelettes after you poop. Do not sit on the toilet for a long time. This can increase blood pooling and pain. Where to find more information [employer] of Diabetes and Digestive and Kidney Diseases: [email or web address] Contact a health care provider if: You have more pain and swelling that do not get better with treatment. You have trouble pooping or you are not able to poop. You have pain or inflammation outside the area of the hemorrhoids. Get help right away if: You are bleeding from your rectum and you cannot get it to stop. This information is not intended to replace advice given to you by your health care provider. Make sure you discuss any questions you have with your health care provider. Document Revised: 07/29/2022 Document Reviewed: 07/29/2022 Elsevier Patient Education  2024 [employer].

## 2024-11-08 NOTE — Telephone Encounter (Signed)
 Patient's insurance will not cover Anusol -HC) 25 mg suppository.  Patient request's a cheaper alternative.   Advise.

## 2024-11-08 NOTE — Telephone Encounter (Signed)
 Patient was contacted by phone and informed her insurance would not cover the prescription for Anusol -HC 25 mg suppositories and that Dr. Nikki had sent in a new prescription for lidocaine  ointment.  Patient was upset when I called her and she stated the Anusol -HC was $30 and she asked for a cheaper medication and it was $35.  I tried to explain to her that we did not see the cost of a prescription, when prescribed.  I asked her if she would check with her insurance company for a covered alternative, I would be happy to check with Dr. Nikki for a new prescription.  She stated she was unhappy with her office visit today and she would be seeing someone else.  I let her know I would inform my clinical supervisor and Dr. Nikki of this and she hung up on me.

## 2024-11-10 ENCOUNTER — Other Ambulatory Visit (HOSPITAL_COMMUNITY): Payer: Self-pay

## 2024-11-10 ENCOUNTER — Other Ambulatory Visit: Payer: Self-pay | Admitting: Obstetrics and Gynecology

## 2024-11-10 NOTE — Telephone Encounter (Signed)
Request already responded to by other means

## 2024-11-14 ENCOUNTER — Other Ambulatory Visit (HOSPITAL_COMMUNITY): Payer: Self-pay
